# Patient Record
Sex: Male | Born: 1935 | Race: White | Hispanic: No | Smoking: Former smoker
Health system: Southern US, Community
[De-identification: ages and names within clinical notes are randomized; demographics above are authoritative.]

## PROBLEM LIST (undated history)

## (undated) DIAGNOSIS — N400 Enlarged prostate without lower urinary tract symptoms: Secondary | ICD-10-CM

## (undated) HISTORY — PX: CATARACT EXTRACTION: SUR2

## (undated) HISTORY — PX: JOINT REPLACEMENT: SHX530

---

## 2001-03-30 ENCOUNTER — Ambulatory Visit (HOSPITAL_COMMUNITY): Admission: RE | Admit: 2001-03-30 | Discharge: 2001-03-30 | Payer: Self-pay | Admitting: *Deleted

## 2012-11-11 ENCOUNTER — Ambulatory Visit (INDEPENDENT_AMBULATORY_CARE_PROVIDER_SITE_OTHER): Payer: Medicare Other | Admitting: Physician Assistant

## 2012-11-11 ENCOUNTER — Encounter: Payer: Self-pay | Admitting: Physician Assistant

## 2012-11-11 VITALS — BP 116/76 | HR 70 | Temp 97.5°F | Ht 67.0 in | Wt 149.8 lb

## 2012-11-11 DIAGNOSIS — IMO0001 Reserved for inherently not codable concepts without codable children: Secondary | ICD-10-CM

## 2012-11-11 NOTE — Progress Notes (Signed)
Patient ID: Wesley Nguyen, male   DOB: Sep 04, 1931, 79 y.o.   MRN: [id number] Pt here to discuss his wife They have been married for 50+yrs He has been dealing with her progressive Dementia over the last 5 yrs Pt states over the last several months the situation has gotten very bad She has gotten very combative with him and members of the family She is constantly swearing and he has had to cover all of the mirrors because she wants to fight any male she sees Pt is not sleeping at night because this is when she becomes more active He has limited family help due to their own disabilities He is NOT wanting to put her in a home because he feels she will be mistreated  Plan- had a long discussion with pt today regarding situation and possible solutions Gina R sat down with pt to cover possible ho,e health options Also discussed at some point for his own health she made to be placed in a home and he was understanding Almira Coaster will review to see what services are available and f/u with pt

## 2013-04-11 ENCOUNTER — Encounter (INDEPENDENT_AMBULATORY_CARE_PROVIDER_SITE_OTHER): Payer: Self-pay

## 2013-04-11 ENCOUNTER — Ambulatory Visit (INDEPENDENT_AMBULATORY_CARE_PROVIDER_SITE_OTHER): Payer: Medicare Other

## 2013-04-11 DIAGNOSIS — Z23 Encounter for immunization: Secondary | ICD-10-CM

## 2013-04-12 ENCOUNTER — Ambulatory Visit: Payer: Medicare Other | Admitting: Family Medicine

## 2013-04-14 ENCOUNTER — Encounter: Payer: Self-pay | Admitting: Family Medicine

## 2013-04-14 ENCOUNTER — Ambulatory Visit (INDEPENDENT_AMBULATORY_CARE_PROVIDER_SITE_OTHER): Payer: Medicare Other | Admitting: Family Medicine

## 2013-04-14 VITALS — BP 113/73 | HR 72 | Temp 97.8°F | Ht 67.0 in | Wt 151.8 lb

## 2013-04-14 DIAGNOSIS — Z7189 Other specified counseling: Secondary | ICD-10-CM

## 2013-04-14 NOTE — Progress Notes (Signed)
  Subjective:    Patient ID: Corky Blumstein, male    DOB: [birth year], 79 y.o.   MRN: [id number]  HPI This 79 y.o. male presents for evaluation of DNR status.  He has met with his [employer] Friends and has drawn up advance directives and has given his children copies. He states his wife is dying in a nursing home due to alzheimers and he has been watching Her decline and wishes to get his affairs in order.   Review of Systems    No chest pain, SOB, HA, dizziness, vision change, N/V, diarrhea, constipation, dysuria, urinary urgency or frequency, myalgias, arthralgias or rash.  Objective:   Physical Exam Vital signs noted  Well developed well nourished male.  HEENT - Head atraumatic Normocephalic                Eyes - PERRLA, Conjuctiva - clear Sclera- Clear EOMI                Ears - EAC's Wnl TM's Wnl Gross Hearing WNL                Nose - Nares patent                 Throat - oropharanx wnl Respiratory - Lungs CTA bilateral Cardiac - RRR S1 and S2 without murmur GI - Abdomen soft Nontender and bowel sounds active x 4 Extremities - No edema. Neuro - Grossly intact.       Assessment & Plan:  Advance directives - DNR status / Form is signed.  Deatra Canter FNP

## 2013-04-19 NOTE — Addendum Note (Signed)
Addended by: Ardine Eng A on: 04/19/2013 11:19 AM   Modules accepted: Orders

## 2014-03-06 DIAGNOSIS — Z23 Encounter for immunization: Secondary | ICD-10-CM | POA: Diagnosis not present

## 2015-03-02 DIAGNOSIS — Z23 Encounter for immunization: Secondary | ICD-10-CM | POA: Diagnosis not present

## 2015-06-19 ENCOUNTER — Ambulatory Visit (INDEPENDENT_AMBULATORY_CARE_PROVIDER_SITE_OTHER): Payer: Medicare Other | Admitting: Cardiology

## 2015-06-19 ENCOUNTER — Encounter: Payer: Self-pay | Admitting: Cardiology

## 2015-06-19 VITALS — BP 117/77 | HR 82 | Ht 66.5 in | Wt 145.8 lb

## 2015-06-19 DIAGNOSIS — R42 Dizziness and giddiness: Secondary | ICD-10-CM | POA: Diagnosis not present

## 2015-06-19 NOTE — Progress Notes (Signed)
Patient ID: Wesley Nguyen, male   DOB: 03/12/1932, 81 y.o.   MRN: [id number]     Clinical Summary Wesley Nguyen is a 81 y.o.male seen today as a new patient for the following medical problems.  1. Dizziness  - started approx 4 weeks ago. Patient associates the starting of symptoms about 10 days after he started taking naproxen for back pain.  - dizzy spells could occur at any postion. No palpitations. Felt weak all over. No SOB or chest pain. Lasts up to 20 seconds. Could occur up to 3 times a day. Family member would check his pulse at times and note some irregularity. Patient denies any palpitations.  - patient reports drinking coffee x 4-8 cups daily, no tea, no sodas. No EtoH. Limited water intake.  - he recently has cut back on caffeine and symptoms have improved.    PMH 1. BPH   Allergies  Allergen Reactions  . Bactrim [Sulfamethoxazole-Trimethoprim] Rash     Current Outpatient Prescriptions  Medication Sig Dispense Refill  . tamsulosin (FLOMAX) 0.4 MG CAPS capsule Take 0.4 mg by mouth daily.     No current facility-administered medications for this visit.     Past Surgical History  Procedure Laterality Date  . Joint replacement      knee     Allergies  Allergen Reactions  . Bactrim [Sulfamethoxazole-Trimethoprim] Rash      Patient denies any family history of heart disease.    Social History Wesley Nguyen reports that he quit smoking about 52 years ago. His smoking use included Cigarettes. He started smoking about 66 years ago. He smoked 3.00 packs per day. He does not have any smokeless tobacco history on file. Wesley Nguyen reports that he does not drink alcohol.   Review of Systems CONSTITUTIONAL: No weight loss, fever, chills, weakness or fatigue.  HEENT: Eyes: No visual loss, blurred vision, double vision or yellow sclerae.No hearing loss, sneezing, congestion, runny nose or sore throat.  SKIN: No rash or itching.  CARDIOVASCULAR:  RESPIRATORY: No  shortness of breath, cough or sputum.  GASTROINTESTINAL: No anorexia, nausea, vomiting or diarrhea. No abdominal pain or blood.  GENITOURINARY: No burning on urination, no polyuria NEUROLOGICAL:dizziness MUSCULOSKELETAL: No muscle, back pain, joint pain or stiffness.  LYMPHATICS: No enlarged nodes. No history of splenectomy.  PSYCHIATRIC: No history of depression or anxiety.  ENDOCRINOLOGIC: No reports of sweating, cold or heat intolerance. No polyuria or polydipsia.  Marland Kitchen.   Physical Examination Filed Vitals:   06/19/15 1330  BP: 117/77  Pulse: 82   Filed Vitals:   06/19/15 1330  Height: 5' 6.5" (1.689 m)  Weight: 145 lb 12.8 oz (66.134 kg)    Gen: resting comfortably, no acute distress HEENT: no scleral icterus, pupils equal round and reactive, no palptable cervical adenopathy,  CV: RRR, no m/r/g, no jvd Resp: Clear to auscultation bilaterally GI: abdomen is soft, non-tender, non-distended, normal bowel sounds, no hepatosplenomegaly MSK: extremities are warm, no edema.  Skin: warm, no rash Neuro:  no focal deficits Psych: appropriate affect   Diagnostic Studies  EKG(reviewed in clinic today): NSR   Assessment and Plan  1. Dizziness - patient is borderline orthostatic by pulse (12 beat increase with standing) in clinic today. He previously had a very large caffeine intake that since cutting back on has resolved his symptoms. I suspect he was hypovolemic from drinking primarily caffeinated beverages with little to no other intake and I suspect he irregular pulse was also triggered by the stimulant effects of  such large caffeine intake. Since curring back symptoms have resolved - conitnue to follow symptoms, if reoccur off caffeine with increased water/hydration would consider home monitor         Antoine Poche, M.D.

## 2015-06-19 NOTE — Patient Instructions (Signed)
Medication Instructions:  Your physician recommends that you continue on your current medications as directed. Please refer to the Current Medication list given to you today.   Labwork: NONE  Testing/Procedures: NONE  Follow-Up: Your physician wants you to follow-up in: 6 months with Dr.Branch. You will receive a reminder letter in the mail two months in advance. If you don't receive a letter, please call our office to schedule the follow-up appointment.   Any Other Special Instructions Will Be Listed Below (If Applicable).     If you need a refill on your cardiac medications before your next appointment, please call your pharmacy.  Thanks for choosing [facility]!!!     

## 2016-03-25 DIAGNOSIS — Z23 Encounter for immunization: Secondary | ICD-10-CM | POA: Diagnosis not present

## 2017-03-24 DIAGNOSIS — Z23 Encounter for immunization: Secondary | ICD-10-CM | POA: Diagnosis not present

## 2018-03-25 DIAGNOSIS — Z23 Encounter for immunization: Secondary | ICD-10-CM | POA: Diagnosis not present

## 2019-04-11 ENCOUNTER — Other Ambulatory Visit: Payer: Self-pay

## 2019-04-11 ENCOUNTER — Encounter (HOSPITAL_COMMUNITY): Payer: Self-pay

## 2019-04-11 ENCOUNTER — Emergency Department (HOSPITAL_COMMUNITY)
Admission: EM | Admit: 2019-04-11 | Discharge: 2019-04-11 | Disposition: A | Discharge status: Home / Self Care | Payer: Medicare HMO | Attending: Emergency Medicine | Admitting: Emergency Medicine

## 2019-04-11 ENCOUNTER — Emergency Department (HOSPITAL_COMMUNITY): Payer: Medicare HMO

## 2019-04-11 DIAGNOSIS — R059 Cough, unspecified: Secondary | ICD-10-CM

## 2019-04-11 DIAGNOSIS — R05 Cough: Secondary | ICD-10-CM | POA: Insufficient documentation

## 2019-04-11 DIAGNOSIS — R197 Diarrhea, unspecified: Secondary | ICD-10-CM | POA: Diagnosis not present

## 2019-04-11 DIAGNOSIS — Z79899 Other long term (current) drug therapy: Secondary | ICD-10-CM | POA: Diagnosis not present

## 2019-04-11 DIAGNOSIS — Z87891 Personal history of nicotine dependence: Secondary | ICD-10-CM | POA: Diagnosis not present

## 2019-04-11 LAB — COMPREHENSIVE METABOLIC PANEL
ALT: 16 U/L (ref 0–44)
AST: 26 U/L (ref 15–41)
Albumin: 3.7 g/dL (ref 3.5–5.0)
Alkaline Phosphatase: 51 U/L (ref 38–126)
Anion gap: 10 (ref 5–15)
BUN: 11 mg/dL (ref 8–23)
CO2: 25 mmol/L (ref 22–32)
Calcium: 8.7 mg/dL — ABNORMAL LOW (ref 8.9–10.3)
Chloride: 102 mmol/L (ref 98–111)
Creatinine, Ser: 1.35 mg/dL — ABNORMAL HIGH (ref 0.61–1.24)
GFR calc Af Amer: 55 mL/min — ABNORMAL LOW (ref >60)
GFR calc non Af Amer: 48 mL/min — ABNORMAL LOW (ref >60)
Glucose, Bld: 92 mg/dL (ref 70–99)
Potassium: 4.2 mmol/L (ref 3.5–5.1)
Sodium: 137 mmol/L (ref 135–145)
Total Bilirubin: 0.7 mg/dL (ref 0.3–1.2)
Total Protein: 6.6 g/dL (ref 6.5–8.1)

## 2019-04-11 LAB — CBC WITH DIFFERENTIAL/PLATELET
Abs Immature Granulocytes: 0.02 10*3/uL (ref 0.00–0.07)
Basophils Absolute: 0 10*3/uL (ref 0.0–0.1)
Basophils Relative: 1 %
Eosinophils Absolute: 0 10*3/uL (ref 0.0–0.5)
Eosinophils Relative: 0 %
HCT: 42.9 % (ref 39.0–52.0)
Hemoglobin: 13.7 g/dL (ref 13.0–17.0)
Immature Granulocytes: 0 %
Lymphocytes Relative: 25 %
Lymphs Abs: 1.6 10*3/uL (ref 0.7–4.0)
MCH: 29.3 pg (ref 26.0–34.0)
MCHC: 31.9 g/dL (ref 30.0–36.0)
MCV: 91.9 fL (ref 80.0–100.0)
Monocytes Absolute: 0.8 10*3/uL (ref 0.1–1.0)
Monocytes Relative: 13 %
Neutro Abs: 3.9 10*3/uL (ref 1.7–7.7)
Neutrophils Relative %: 61 %
Platelets: 212 10*3/uL (ref 150–400)
RBC: 4.67 MIL/uL (ref 4.22–5.81)
RDW: 14.4 % (ref 11.5–15.5)
WBC: 6.5 10*3/uL (ref 4.0–10.5)
nRBC: 0 % (ref 0.0–0.2)

## 2019-04-11 MED ORDER — ONDANSETRON 4 MG PO TBDP: 4.0000 mg | ORAL_TABLET | Freq: Three times a day (TID) | ORAL | 0 refills | Status: DC | PRN

## 2019-04-11 MED ORDER — PREDNISONE 10 MG (21) PO TBPK: ORAL_TABLET | ORAL | 0 refills | Status: DC

## 2019-04-11 MED ORDER — ALBUTEROL SULFATE HFA 108 (90 BASE) MCG/ACT IN AERS: 2.0000 | INHALATION_SPRAY | RESPIRATORY_TRACT | 0 refills | Status: DC | PRN

## 2019-04-11 MED ORDER — BENZONATATE 100 MG PO CAPS: 100.0000 mg | ORAL_CAPSULE | Freq: Three times a day (TID) | ORAL | 0 refills | Status: DC

## 2019-04-11 NOTE — ED Notes (Signed)
Patient daughter calling asking for an update  Please call Audelia Hives  [phone]

## 2019-04-11 NOTE — ED Notes (Signed)
Patient verbalizes understanding of discharge instructions. Opportunity for questioning and answers were provided. Armband removed by staff, pt discharged from ED. Pt. ambulatory and discharged home.  

## 2019-04-11 NOTE — ED Provider Notes (Signed)
[facility] - Closed EMERGENCY DEPARTMENT Provider Note   CSN: [id number] Arrival date & time: 04/11/19  1436     History   Chief Complaint Chief Complaint  Patient presents with  . Cough  . Diarrhea    HPI Nathanael Krist is a 85 y.o. male.     HPI   Zohair Epp is a 85 y.o. male, presenting to the ED with cough for the last 2 days.  He had a single episode of diarrhea today.  Fever today with T-max of 101 F. He states he had close contact with a person at church on October 14 who he thinks later tested positive for COVID-19. He underwent testing at the health department yesterday, results are pending. Denies shortness of breath, chest pain, abdominal pain, vomiting, hematochezia/melena, urinary symptoms, dizziness, syncope, or any other complaints.   History reviewed. No pertinent past medical history.  There are no active problems to display for this patient.   Past Surgical History:  Procedure Laterality Date  . JOINT REPLACEMENT     knee        Home Medications    Prior to Admission medications   Medication Sig Start Date End Date Taking? Authorizing Provider  albuterol (VENTOLIN HFA) 108 (90 Base) MCG/ACT inhaler Inhale 2 puffs into the lungs every 4 (four) hours as needed for wheezing or shortness of breath. 04/11/19   Orion Vandervort C, PA-C  benzonatate (TESSALON) 100 MG capsule Take 1 capsule (100 mg total) by mouth every 8 (eight) hours. 04/11/19   Falicity Sheets C, PA-C  ondansetron (ZOFRAN ODT) 4 MG disintegrating tablet Take 1 tablet (4 mg total) by mouth every 8 (eight) hours as needed for nausea or vomiting. 04/11/19   Jowell Bossi C, PA-C  predniSONE (STERAPRED UNI-PAK 21 TAB) 10 MG (21) TBPK tablet Take 6 tabs (60mg ) day 1, 5 tabs (50mg ) day 2, 4 tabs (40mg ) day 3, 3 tabs (30mg ) day 4, 2 tabs (20mg ) day 5, and 1 tab (10mg ) day 6. 04/11/19   Georgie Haque C, PA-C  tamsulosin (FLOMAX) 0.4 MG CAPS capsule Take 0.4 mg by mouth daily.    [provider]  traMADol (ULTRAM) 50 MG tablet Take by mouth every 6 (six) hours as needed.    [provider]    Family History No family history on file.  Social History Social History   Tobacco Use  . Smoking status: Former Smoker    Packs/day: 3.00    Types: Cigarettes    Start date: 01/23/1949    Quit date: 06/23/1963    Years since quitting: 55.8  Substance Use Topics  . Alcohol use: No    Alcohol/week: 0.0 standard drinks  . Drug use: No     Allergies   Naproxen sodium and Bactrim [sulfamethoxazole-trimethoprim]   Review of Systems Review of Systems  Constitutional: Positive for fever.  Respiratory: Positive for cough. Negative for shortness of breath.   Cardiovascular: Negative for chest pain and leg swelling.  Gastrointestinal: Positive for diarrhea. Negative for abdominal pain, blood in stool, nausea and vomiting.  Genitourinary: Negative for dysuria, frequency and hematuria.  Neurological: Negative for dizziness, syncope, weakness, light-headedness and headaches.  All other systems reviewed and are negative.    Physical Exam Updated Vital Signs BP (!) 154/92 (BP Location: Left Arm)   Pulse 69   Temp 97.7 F (36.5 C) (Oral)   Resp 16   Ht 5\' 6"  (1.676 m)   Wt 63.5 kg   SpO2 98%  BMI 22.60 kg/m   Physical Exam Vitals signs and nursing note reviewed.  Constitutional:      General: He is not in acute distress.    Appearance: He is well-developed. He is not diaphoretic.  HENT:     Head: Normocephalic and atraumatic.     Mouth/Throat:     Mouth: Mucous membranes are moist.     Pharynx: Oropharynx is clear.  Eyes:     Conjunctiva/sclera: Conjunctivae normal.  Neck:     Musculoskeletal: Neck supple.  Cardiovascular:     Rate and Rhythm: Normal rate and regular rhythm.     Pulses: Normal pulses.          Radial pulses are 2+ on the right side and 2+ on the left side.       Posterior tibial pulses are 2+ on the right side and 2+ on the  left side.     Heart sounds: Normal heart sounds.     Comments: Tactile temperature in the extremities appropriate and equal bilaterally. Pulmonary:     Effort: Pulmonary effort is normal. No respiratory distress.     Breath sounds: Normal breath sounds.  Abdominal:     Palpations: Abdomen is soft.     Tenderness: There is no abdominal tenderness. There is no guarding.  Musculoskeletal:     Right lower leg: No edema.     Left lower leg: No edema.  Lymphadenopathy:     Cervical: No cervical adenopathy.  Skin:    General: Skin is warm and dry.  Neurological:     Mental Status: He is alert.  Psychiatric:        Mood and Affect: Mood and affect normal.        Speech: Speech normal.        Behavior: Behavior normal.      ED Treatments / Results  Labs (all labs ordered are listed, but only abnormal results are displayed) Labs Reviewed  COMPREHENSIVE METABOLIC PANEL - Abnormal; Notable for the following components:      Result Value   Creatinine, Ser 1.35 (*)    Calcium 8.7 (*)    GFR calc non Af Amer 48 (*)    GFR calc Af Amer 55 (*)    All other components within normal limits  CBC WITH DIFFERENTIAL/PLATELET    EKG None  Radiology Dg Chest Portable 1 View  Result Date: 04/11/2019 CLINICAL DATA:  Cough and fever. Diarrhea. EXAM: PORTABLE CHEST 1 VIEW COMPARISON:  None. FINDINGS: The heart size and pulmonary vascularity are normal. Aortic atherosclerosis. Calcified granulomas in both lungs. No acute infiltrates or effusions. Lucency in the upper lung zones suggests emphysema. No acute bone abnormality. IMPRESSION: 1. Aortic atherosclerosis. 2. Possible emphysema. Electronically Signed   By: Lorriane Shire M.D.   On: 04/11/2019 20:32    Procedures Procedures (including critical care time)  Medications Ordered in ED Medications - No data to display   Initial Impression / Assessment and Plan / ED Course  I have reviewed the triage vital signs and the nursing notes.   Pertinent labs & imaging results that were available during my care of the patient were reviewed by me and considered in my medical decision making (see chart for details).        Patient presents with cough, diarrhea, and fever.  Possible exposure to COVID-19.  Results from outside testing are pending. Patient is nontoxic appearing, afebrile here, not tachycardic, not tachypneic, not hypotensive, maintains excellent SPO2 on room air,  and is in no apparent distress.  Isolation recommendations discussed. The patient was given instructions for home care as well as return precautions. Patient voices understanding of these instructions, accepts the plan, and is comfortable with discharge.  Findings and plan of care discussed with Raeford RazorStephen Kohut, MD.   Final Clinical Impressions(s) / ED Diagnoses   Final diagnoses:  Cough  Diarrhea, unspecified type    ED Discharge Orders         Ordered    albuterol (VENTOLIN HFA) 108 (90 Base) MCG/ACT inhaler  Every 4 hours PRN     04/11/19 2109    predniSONE (STERAPRED UNI-PAK 21 TAB) 10 MG (21) TBPK tablet     04/11/19 2109    benzonatate (TESSALON) 100 MG capsule  Every 8 hours     04/11/19 2109    ondansetron (ZOFRAN ODT) 4 MG disintegrating tablet  Every 8 hours PRN     04/11/19 2111           Concepcion LivingJoy, Camey Edell C, PA-C 04/11/19 2120    Raeford RazorKohut, Stephen, MD 04/11/19 2222

## 2019-04-11 NOTE — ED Triage Notes (Signed)
Pt reports cough, diarrhea x1, subjective fevers and sore throat since Sunday.   Pt exposed Wednesday at [place]. Pt a.o, nad , resp e.u

## 2019-04-11 NOTE — Discharge Instructions (Signed)
Your symptoms are likely consistent with a viral illness. Viruses do not require or respond to antibiotics. Treatment is symptomatic care and it is important to note that these symptoms may last for 7-14 days.   Hand washing: Wash your hands throughout the day, but especially before and after touching the face, using the restroom, sneezing, coughing, or touching surfaces that have been coughed or sneezed upon. Hydration: Symptoms of most illnesses will be intensified and complicated by dehydration. Dehydration can also extend the duration of symptoms. Drink plenty of fluids and get plenty of rest. You should be drinking at least half a liter of water an hour to stay hydrated. Electrolyte drinks (ex. Gatorade, Powerade, Pedialyte) are also encouraged. You should be drinking enough fluids to make your urine light yellow, almost clear. If this is not the case, you are not drinking enough water. Please note that some of the treatments indicated below will not be effective if you are not adequately hydrated. Diet: Please concentrate on hydration, however, you may introduce food slowly.  Start with a clear liquid diet, progressed to a full liquid diet, and then bland solids as you are able. Pain or fever: Ibuprofen, Naproxen, or acetaminophen (generic for Tylenol) for pain or fever.  Nausea/vomiting: Use the ondansetron (generic for Zofran) for nausea or vomiting.  This medication may not prevent all vomiting or nausea, but can help facilitate better hydration. Things that can help with nausea/vomiting also include peppermint/menthol candies, vitamin B12, and ginger. Diarrhea: May use medications such as loperamide (Imodium) or Bismuth subsalicylate (Pepto-Bismol). Cough: Use the benzonatate (generic for Tessalon) for cough.  Teas, warm liquids, broths, and honey can also help with cough. Albuterol: May use the albuterol as needed for instances of shortness of breath. Prednisone: Take the prednisone, as  directed, in its entirety. Zyrtec or Claritin: May add these medication daily to control underlying symptoms of congestion, sneezing, and other signs of allergies.  These medications are available over-the-counter. Generics: Cetirizine (generic for Zyrtec) and loratadine (generic for Claritin). Fluticasone: Use fluticasone (generic for Flonase), as directed, for nasal and sinus congestion.  This medication is available over-the-counter. Congestion: Plain guaifenesin (generic for plain Mucinex) may help relieve congestion. Saline sinus rinses and saline nasal sprays may also help relieve congestion.  Sore throat: Warm liquids or Chloraseptic spray may help soothe a sore throat. Gargle twice a day with a salt water solution made from a half teaspoon of salt in a cup of warm water.  Follow up: Follow up with a primary care provider within the next two weeks should symptoms fail to resolve. Return: Return to the ED for significantly worsening symptoms, shortness of breath, persistent vomiting, large amounts of blood in stool, or any other major concerns.  For prescription assistance, may try using prescription discount sites or apps, such as goodrx.com  Patients who have symptoms consistent with COVID-19 should self isolate until: At least 3 days (72 hours) have passed since recovery, defined as resolution of fever without the use of fever reducing medications and improvement in respiratory symptoms (e.g., cough, shortness of breath), and At least 7 days have passed since symptoms first appeared.

## 2019-04-11 NOTE — ED Triage Notes (Signed)
Pt had a COVID test done yesterday at the [facility] but has not gotten results back yet.

## 2019-05-23 HISTORY — PX: EXTERNAL EAR SURGERY: SHX627

## 2019-08-30 ENCOUNTER — Encounter (HOSPITAL_COMMUNITY): Payer: Self-pay

## 2019-08-30 ENCOUNTER — Inpatient Hospital Stay (HOSPITAL_COMMUNITY)
Admission: EM | Admit: 2019-08-30 | Discharge: 2019-09-01 | DRG: 369 | Disposition: A | Discharge status: Home / Self Care | Payer: Medicare HMO | Attending: Internal Medicine | Admitting: Internal Medicine

## 2019-08-30 ENCOUNTER — Other Ambulatory Visit: Payer: Self-pay

## 2019-08-30 DIAGNOSIS — K449 Diaphragmatic hernia without obstruction or gangrene: Secondary | ICD-10-CM | POA: Diagnosis present

## 2019-08-30 DIAGNOSIS — K226 Gastro-esophageal laceration-hemorrhage syndrome: Principal | ICD-10-CM | POA: Diagnosis present

## 2019-08-30 DIAGNOSIS — Z20822 Contact with and (suspected) exposure to covid-19: Secondary | ICD-10-CM | POA: Diagnosis present

## 2019-08-30 DIAGNOSIS — Z881 Allergy status to other antibiotic agents status: Secondary | ICD-10-CM

## 2019-08-30 DIAGNOSIS — E860 Dehydration: Secondary | ICD-10-CM | POA: Diagnosis present

## 2019-08-30 DIAGNOSIS — K920 Hematemesis: Secondary | ICD-10-CM | POA: Diagnosis not present

## 2019-08-30 DIAGNOSIS — R531 Weakness: Secondary | ICD-10-CM

## 2019-08-30 DIAGNOSIS — Z96659 Presence of unspecified artificial knee joint: Secondary | ICD-10-CM | POA: Diagnosis present

## 2019-08-30 DIAGNOSIS — R112 Nausea with vomiting, unspecified: Secondary | ICD-10-CM | POA: Diagnosis present

## 2019-08-30 DIAGNOSIS — K922 Gastrointestinal hemorrhage, unspecified: Secondary | ICD-10-CM

## 2019-08-30 DIAGNOSIS — N400 Enlarged prostate without lower urinary tract symptoms: Secondary | ICD-10-CM | POA: Diagnosis present

## 2019-08-30 DIAGNOSIS — K2210 Ulcer of esophagus without bleeding: Secondary | ICD-10-CM | POA: Diagnosis present

## 2019-08-30 DIAGNOSIS — Z886 Allergy status to analgesic agent status: Secondary | ICD-10-CM

## 2019-08-30 DIAGNOSIS — K2100 Gastro-esophageal reflux disease with esophagitis, without bleeding: Secondary | ICD-10-CM | POA: Diagnosis present

## 2019-08-30 DIAGNOSIS — Z87891 Personal history of nicotine dependence: Secondary | ICD-10-CM

## 2019-08-30 DIAGNOSIS — Z66 Do not resuscitate: Secondary | ICD-10-CM | POA: Diagnosis present

## 2019-08-30 HISTORY — DX: Benign prostatic hyperplasia without lower urinary tract symptoms: N40.0

## 2019-08-30 MED ORDER — SODIUM CHLORIDE 0.9 % IV BOLUS
1000.0000 mL | Freq: Once | INTRAVENOUS | Status: AC
  Administered 2019-08-30: 1000 mL via INTRAVENOUS

## 2019-08-30 MED ORDER — ONDANSETRON HCL 4 MG/2ML IJ SOLN
4.0000 mg | Freq: Once | INTRAMUSCULAR | Status: AC
  Administered 2019-08-30: 4 mg via INTRAVENOUS
  Filled 2019-08-30: qty 2

## 2019-08-30 NOTE — ED Triage Notes (Signed)
Pt arrived via [facility] from home c/o bloody emesis which began this evening. Pt reports dizziness at his time and has 5 episodes of emesis. Pt denies any pain.

## 2019-08-30 NOTE — ED Provider Notes (Signed)
Wesley Nguyen Provider Note   CSN: [id number] Arrival date & time: 08/30/19  2337   History Chief Complaint  Patient presents with  . Emesis    Wesley Nguyen is a 86 y.o. male.  The history is provided by the patient.  He started developing nausea about 1 hour ago and has vomited 4 times.  The first 2 times were undigested food, the last 2 times were blood.  He denies abdominal pain.  He has been feeling weak and dizzy since yesterday.  He denies fever, chills, sweats.  He is not taking any anticoagulants.  No past medical history on file.  There are no problems to display for this patient.   Past Surgical History:  Procedure Laterality Date  . JOINT REPLACEMENT     knee       No family history on file.  Social History   Tobacco Use  . Smoking status: Former Smoker    Packs/day: 3.00    Types: Cigarettes    Start date: 01/23/1949    Quit date: 06/23/1963    Years since quitting: 56.2  Substance Use Topics  . Alcohol use: No    Alcohol/week: 0.0 standard drinks  . Drug use: No    Home Medications Prior to Admission medications   Medication Sig Start Date End Date Taking? Authorizing Provider  albuterol (VENTOLIN HFA) 108 (90 Base) MCG/ACT inhaler Inhale 2 puffs into the lungs every 4 (four) hours as needed for wheezing or shortness of breath. 04/11/19   Joy, Shawn C, PA-C  benzonatate (TESSALON) 100 MG capsule Take 1 capsule (100 mg total) by mouth every 8 (eight) hours. 04/11/19   Joy, Shawn C, PA-C  ondansetron (ZOFRAN ODT) 4 MG disintegrating tablet Take 1 tablet (4 mg total) by mouth every 8 (eight) hours as needed for nausea or vomiting. 04/11/19   Joy, Shawn C, PA-C  predniSONE (STERAPRED UNI-PAK 21 TAB) 10 MG (21) TBPK tablet Take 6 tabs (60mg ) day 1, 5 tabs (50mg ) day 2, 4 tabs (40mg ) day 3, 3 tabs (30mg ) day 4, 2 tabs (20mg ) day 5, and 1 tab (10mg ) day 6. 04/11/19   Joy, Shawn C, PA-C  tamsulosin (FLOMAX) 0.4 MG CAPS capsule Take 0.4 mg  by mouth daily.    [provider]  traMADol (ULTRAM) 50 MG tablet Take by mouth every 6 (six) hours as needed.    [provider]    Allergies    Naproxen sodium and Bactrim [sulfamethoxazole-trimethoprim]  Review of Systems   Review of Systems  All other systems reviewed and are negative.   Physical Exam Updated Vital Signs BP 119/77   Pulse 72   Temp (!) 97.4 F (36.3 C) (Oral)   Resp 16   Ht 5\' 6"  (1.676 m)   Wt 80.3 kg   SpO2 100%   BMI 28.58 kg/m   Physical Exam Vitals and nursing note reviewed.   86 year old male, resting comfortably and in no acute distress. Vital signs are normal. Oxygen saturation is 100%, which is normal. Head is normocephalic and atraumatic. PERRLA, EOMI. Oropharynx is clear. Neck is nontender and supple without adenopathy or JVD. Back is nontender and there is no CVA tenderness. Lungs are clear without rales, wheezes, or rhonchi. Chest is nontender. Heart has regular rate and rhythm without murmur. Abdomen is soft, flat, nontender without masses or hepatosplenomegaly and peristalsis is hypoactive. Extremities have no cyanosis or edema, full range of motion is present. Skin is warm and  dry without rash. Neurologic: Mental status is normal, cranial nerves are intact, there are no motor or sensory deficits.  ED Results / Procedures / Treatments   Labs (all labs ordered are listed, but only abnormal results are displayed) Labs Reviewed  COMPREHENSIVE METABOLIC PANEL - Abnormal; Notable for the following components:      Result Value   Glucose, Bld 159 (*)    Calcium 8.0 (*)    Total Protein 5.8 (*)    Albumin 3.3 (*)    GFR calc non Af Amer 57 (*)    All other components within normal limits  CBC WITH DIFFERENTIAL/PLATELET - Abnormal; Notable for the following components:   WBC 13.6 (*)    RBC 3.84 (*)    Hemoglobin 11.6 (*)    HCT 36.1 (*)    Neutro Abs 11.6 (*)    Abs Immature Granulocytes 0.10 (*)    All other  components within normal limits  HEMOGLOBIN AND HEMATOCRIT, BLOOD - Abnormal; Notable for the following components:   Hemoglobin 11.9 (*)    HCT 36.9 (*)    All other components within normal limits  OCCULT BLOOD GASTRIC / DUODENUM (SPECIMEN CUP) - Abnormal; Notable for the following components:   Occult Blood, Gastric POSITIVE (*)    All other components within normal limits  SARS CORONAVIRUS 2 (TAT 6-24 HRS)  LIPASE, BLOOD  TYPE AND SCREEN    EKG EKG Interpretation  Date/Time:  Wednesday August 30 2019 23:54:11 EST Ventricular Rate:  69 PR Interval:    QRS Duration: 102 QT Interval:  425 QTC Calculation: 456 R Axis:   86 Text Interpretation: Sinus rhythm Atrial premature complex Borderline right axis deviation No old tracing to compare Confirmed by Delora Fuel ([id number]) on 08/30/2019 11:58:25 PM  Procedures Procedures  Medications Ordered in ED Medications  sodium chloride 0.9 % bolus 1,000 mL (0 mLs Intravenous Stopped 08/31/19 0122)  ondansetron (ZOFRAN) injection 4 mg (4 mg Intravenous Given 08/30/19 2358)  prochlorperazine (COMPAZINE) injection 10 mg (10 mg Intravenous Given 08/31/19 0419)    ED Course  I have reviewed the triage vital signs and the nursing notes.  Pertinent labs & imaging results that were available during my care of the patient were reviewed by me and considered in my medical decision making (see chart for details).  MDM Rules/Calculators/A&P Nausea and vomiting with emesis of red material which may have been blood.  Exam is benign.  Old records are reviewed, and he has no relevant past visits.  Will check screening labs, give IV fluids and ondansetron.  Labs show approximately 2 g drop in hemoglobin compared with 6 months ago.  He initially had some improvement in nausea, but nausea progressed.  He was given prochlorperazine but vomited some dark material following that.  This was tested and found to be Gastroccult positive.  Renal function and hepatic  function are normal.  Attempt was made to get orthostatic vital signs, but he was unable to stand because of weakness.  Repeat hemoglobin actually showed hemoglobin had risen slightly.  However, given episode of hematemesis and severe weakness, decision was made to admit.  Case is discussed with Dr. Humphrey Rolls of [facility], who agrees to admit the patient.  Final Clinical Impression(s) / ED Diagnoses Final diagnoses:  Upper gastrointestinal bleed    Rx / DC Orders ED Discharge Orders    None       Delora Fuel, MD 95/62/13 [phone]

## 2019-08-31 ENCOUNTER — Observation Stay (HOSPITAL_COMMUNITY): Payer: Medicare HMO | Admitting: Anesthesiology

## 2019-08-31 ENCOUNTER — Encounter (HOSPITAL_COMMUNITY): Payer: Self-pay | Admitting: Internal Medicine

## 2019-08-31 ENCOUNTER — Encounter (HOSPITAL_COMMUNITY)
Admission: EM | Disposition: A | Discharge status: Home / Self Care | Payer: Self-pay | Source: Home / Self Care | Attending: Internal Medicine

## 2019-08-31 DIAGNOSIS — R531 Weakness: Secondary | ICD-10-CM

## 2019-08-31 DIAGNOSIS — R112 Nausea with vomiting, unspecified: Secondary | ICD-10-CM | POA: Diagnosis not present

## 2019-08-31 DIAGNOSIS — K209 Esophagitis, unspecified without bleeding: Secondary | ICD-10-CM

## 2019-08-31 DIAGNOSIS — K920 Hematemesis: Secondary | ICD-10-CM | POA: Diagnosis present

## 2019-08-31 HISTORY — PX: ESOPHAGOGASTRODUODENOSCOPY (EGD) WITH PROPOFOL: SHX5813

## 2019-08-31 LAB — CBC
HCT: 36 % — ABNORMAL LOW (ref 39.0–52.0)
Hemoglobin: 11.4 g/dL — ABNORMAL LOW (ref 13.0–17.0)
MCH: 29.1 pg (ref 26.0–34.0)
MCHC: 31.7 g/dL (ref 30.0–36.0)
MCV: 91.8 fL (ref 80.0–100.0)
Platelets: 192 10*3/uL (ref 150–400)
RBC: 3.92 MIL/uL — ABNORMAL LOW (ref 4.22–5.81)
RDW: 13.9 % (ref 11.5–15.5)
WBC: 10.6 10*3/uL — ABNORMAL HIGH (ref 4.0–10.5)
nRBC: 0 % (ref 0.0–0.2)

## 2019-08-31 LAB — CBC WITH DIFFERENTIAL/PLATELET
Abs Immature Granulocytes: 0.1 10*3/uL — ABNORMAL HIGH (ref 0.00–0.07)
Basophils Absolute: 0 10*3/uL (ref 0.0–0.1)
Basophils Relative: 0 %
Eosinophils Absolute: 0.1 10*3/uL (ref 0.0–0.5)
Eosinophils Relative: 0 %
HCT: 36.1 % — ABNORMAL LOW (ref 39.0–52.0)
Hemoglobin: 11.6 g/dL — ABNORMAL LOW (ref 13.0–17.0)
Immature Granulocytes: 1 %
Lymphocytes Relative: 10 %
Lymphs Abs: 1.3 10*3/uL (ref 0.7–4.0)
MCH: 30.2 pg (ref 26.0–34.0)
MCHC: 32.1 g/dL (ref 30.0–36.0)
MCV: 94 fL (ref 80.0–100.0)
Monocytes Absolute: 0.6 10*3/uL (ref 0.1–1.0)
Monocytes Relative: 4 %
Neutro Abs: 11.6 10*3/uL — ABNORMAL HIGH (ref 1.7–7.7)
Neutrophils Relative %: 85 %
Platelets: 208 10*3/uL (ref 150–400)
RBC: 3.84 MIL/uL — ABNORMAL LOW (ref 4.22–5.81)
RDW: 13.9 % (ref 11.5–15.5)
WBC: 13.6 10*3/uL — ABNORMAL HIGH (ref 4.0–10.5)
nRBC: 0 % (ref 0.0–0.2)

## 2019-08-31 LAB — COMPREHENSIVE METABOLIC PANEL
ALT: 16 U/L (ref 0–44)
AST: 22 U/L (ref 15–41)
Albumin: 3.3 g/dL — ABNORMAL LOW (ref 3.5–5.0)
Alkaline Phosphatase: 45 U/L (ref 38–126)
Anion gap: 6 (ref 5–15)
BUN: 17 mg/dL (ref 8–23)
CO2: 25 mmol/L (ref 22–32)
Calcium: 8 mg/dL — ABNORMAL LOW (ref 8.9–10.3)
Chloride: 106 mmol/L (ref 98–111)
Creatinine, Ser: 1.16 mg/dL (ref 0.61–1.24)
GFR calc Af Amer: >60 mL/min (ref >60)
GFR calc non Af Amer: 57 mL/min — ABNORMAL LOW (ref >60)
Glucose, Bld: 159 mg/dL — ABNORMAL HIGH (ref 70–99)
Potassium: 3.6 mmol/L (ref 3.5–5.1)
Sodium: 137 mmol/L (ref 135–145)
Total Bilirubin: 0.4 mg/dL (ref 0.3–1.2)
Total Protein: 5.8 g/dL — ABNORMAL LOW (ref 6.5–8.1)

## 2019-08-31 LAB — LIPASE, BLOOD: Lipase: 43 U/L (ref 11–51)

## 2019-08-31 LAB — RESPIRATORY PANEL BY RT PCR (FLU A&B, COVID)
Influenza A by PCR: NEGATIVE
Influenza B by PCR: NEGATIVE
SARS Coronavirus 2 by RT PCR: NEGATIVE

## 2019-08-31 LAB — OCCULT BLOOD GASTRIC / DUODENUM (SPECIMEN CUP)
Occult Blood, Gastric: POSITIVE — AB
pH, Gastric: 2

## 2019-08-31 LAB — HEMOGLOBIN AND HEMATOCRIT, BLOOD
HCT: 36.9 % — ABNORMAL LOW (ref 39.0–52.0)
Hemoglobin: 11.9 g/dL — ABNORMAL LOW (ref 13.0–17.0)

## 2019-08-31 LAB — SARS CORONAVIRUS 2 (TAT 6-24 HRS): SARS Coronavirus 2: NEGATIVE

## 2019-08-31 LAB — TYPE AND SCREEN
ABO/RH(D): O POS
Antibody Screen: NEGATIVE

## 2019-08-31 SURGERY — ESOPHAGOGASTRODUODENOSCOPY (EGD) WITH PROPOFOL
Anesthesia: General

## 2019-08-31 MED ORDER — MEPERIDINE HCL 50 MG/ML IJ SOLN
INTRAMUSCULAR | Status: AC
  Filled 2019-08-31: qty 1

## 2019-08-31 MED ORDER — ALBUTEROL SULFATE (2.5 MG/3ML) 0.083% IN NEBU: 3.0000 mL | INHALATION_SOLUTION | RESPIRATORY_TRACT | Status: DC | PRN

## 2019-08-31 MED ORDER — PANTOPRAZOLE SODIUM 40 MG IV SOLR
40.0000 mg | Freq: Two times a day (BID) | INTRAVENOUS | Status: DC
  Administered 2019-08-31 – 2019-09-01 (×3): 40 mg via INTRAVENOUS
  Filled 2019-08-31 (×3): qty 40

## 2019-08-31 MED ORDER — LACTATED RINGERS IV SOLN
INTRAVENOUS | Status: DC
  Administered 2019-08-31: 1000 mL via INTRAVENOUS

## 2019-08-31 MED ORDER — SUCRALFATE 1 GM/10ML PO SUSP
1.0000 g | Freq: Three times a day (TID) | ORAL | Status: DC
  Administered 2019-08-31 – 2019-09-01 (×4): 1 g via ORAL
  Filled 2019-08-31 (×4): qty 10

## 2019-08-31 MED ORDER — PANTOPRAZOLE SODIUM 40 MG IV SOLR
40.0000 mg | Freq: Once | INTRAVENOUS | Status: AC
  Administered 2019-08-31: 40 mg via INTRAVENOUS
  Filled 2019-08-31: qty 40

## 2019-08-31 MED ORDER — PROPOFOL 500 MG/50ML IV EMUL
INTRAVENOUS | Status: DC | PRN
  Administered 2019-08-31: 150 ug/kg/min via INTRAVENOUS

## 2019-08-31 MED ORDER — PROPOFOL 10 MG/ML IV BOLUS
INTRAVENOUS | Status: DC | PRN
  Administered 2019-08-31 (×2): 20 mg via INTRAVENOUS

## 2019-08-31 MED ORDER — TAMSULOSIN HCL 0.4 MG PO CAPS
0.4000 mg | ORAL_CAPSULE | Freq: Every day | ORAL | Status: DC
  Administered 2019-08-31 – 2019-09-01 (×2): 0.4 mg via ORAL
  Filled 2019-08-31 (×2): qty 1

## 2019-08-31 MED ORDER — SODIUM CHLORIDE 0.9 % IV SOLN: INTRAVENOUS | Status: DC

## 2019-08-31 MED ORDER — ONDANSETRON HCL 4 MG/2ML IJ SOLN
INTRAMUSCULAR | Status: DC | PRN
  Administered 2019-08-31: 4 mg via INTRAVENOUS

## 2019-08-31 MED ORDER — ONDANSETRON HCL 4 MG PO TABS: 4.0000 mg | ORAL_TABLET | Freq: Four times a day (QID) | ORAL | Status: DC | PRN

## 2019-08-31 MED ORDER — ONDANSETRON HCL 4 MG/2ML IJ SOLN: 4.0000 mg | Freq: Four times a day (QID) | INTRAMUSCULAR | Status: DC | PRN

## 2019-08-31 MED ORDER — ACETAMINOPHEN 650 MG RE SUPP: 650.0000 mg | Freq: Four times a day (QID) | RECTAL | Status: DC | PRN

## 2019-08-31 MED ORDER — PANTOPRAZOLE SODIUM 40 MG PO TBEC: 40.0000 mg | DELAYED_RELEASE_TABLET | Freq: Two times a day (BID) | ORAL | Status: DC

## 2019-08-31 MED ORDER — TRAMADOL HCL 50 MG PO TABS: 50.0000 mg | ORAL_TABLET | Freq: Four times a day (QID) | ORAL | Status: DC | PRN

## 2019-08-31 MED ORDER — SODIUM CHLORIDE 0.9 % IV SOLN: INTRAVENOUS | Status: AC

## 2019-08-31 MED ORDER — MIDAZOLAM HCL 5 MG/5ML IJ SOLN
INTRAMUSCULAR | Status: AC
  Filled 2019-08-31: qty 10

## 2019-08-31 MED ORDER — PROCHLORPERAZINE EDISYLATE 10 MG/2ML IJ SOLN
10.0000 mg | Freq: Once | INTRAMUSCULAR | Status: AC
  Administered 2019-08-31: 10 mg via INTRAVENOUS
  Filled 2019-08-31: qty 2

## 2019-08-31 MED ORDER — LIDOCAINE VISCOUS HCL 2 % MT SOLN
OROMUCOSAL | Status: AC
  Filled 2019-08-31: qty 15

## 2019-08-31 MED ORDER — ACETAMINOPHEN 325 MG PO TABS: 650.0000 mg | ORAL_TABLET | Freq: Four times a day (QID) | ORAL | Status: DC | PRN

## 2019-08-31 NOTE — ED Notes (Signed)
Pts son Emin Foree called and updated on Pts status. Dorinda Hill can be reached at [phone] if there are any questions or concerns regarding the Pts care.

## 2019-08-31 NOTE — ED Notes (Signed)
RN obtained verbal consent from Pt to update his family on status. RN spoke with Pts son Dorinda Hill via phone call and updated family on Pts status. RN will call Dorinda Hill [facility]) with any further updates on Pts status.

## 2019-08-31 NOTE — Plan of Care (Signed)

## 2019-08-31 NOTE — Anesthesia Postprocedure Evaluation (Signed)
Anesthesia Post Note  Patient: Wesley Nguyen  Procedure(s) Performed: ESOPHAGOGASTRODUODENOSCOPY (EGD) WITH PROPOFOL (N/A )  Patient location during evaluation: PACU Anesthesia Type: General Level of consciousness: awake Pain management: pain level controlled Vital Signs Assessment: post-procedure vital signs reviewed and stable Respiratory status: spontaneous breathing Cardiovascular status: blood pressure returned to baseline and stable Postop Assessment: no apparent nausea or vomiting Anesthetic complications: no     Last Vitals:  Vitals:   08/31/19 1419 08/31/19 1545  BP: 118/75 114/71  Pulse: 72 (!) 55  Resp: 18 14  Temp: 36.7 C 36.7 C  SpO2: 97% 100%    Last Pain:  Vitals:   08/31/19 1419  TempSrc: Oral  PainSc: 0-No pain                 Jeferson Boozer

## 2019-08-31 NOTE — Progress Notes (Signed)
Per HPI: Wesley Nguyen is a 86 y.o. male with medical history significant of GERD and BPH presented to ED for evaluation of hematemesis.  Patient states that he started developing nausea this evening followed by 4 episodes of nonprojectile vomiting.  The first 2 episodes were only undigested food but the last 2 episodes have undigested food with blood.  Patient denies any fever, chills, headache, dizziness, chest pain, abdominal pain, constipation, diarrhea, melena and blood in the stools.  Patient is not on any blood thinner.  Patient further mentioned that since he came to the ED, his condition has improved and he is feeling much better at this time.  Patient's CODE STATUS is DNR.  3/11: Patient has undergone EGD with findings of erosive esophagitis along with mallory-weiss tear. He is to remain npo today and on carafate along with PPI IV. Appreciate GI assistance. Further management per GI in am with hopeful plans for DC. Repeat CBC in am.  Total care time: 25 minutes.

## 2019-08-31 NOTE — ED Notes (Signed)
Pt experienced first episode of emesis while in ED. MD notified and Gastric Occult collected. Emesis appeared coffee ground liquid in substance.  MD to order antiemetic medication for Pts active symptoms.

## 2019-08-31 NOTE — Progress Notes (Signed)
Report called from PACU

## 2019-08-31 NOTE — H&P (Signed)
History and Physical    Wesley Nguyen ZOX:[id number] DOB: 1932/03/02 DOA: 08/30/2019  PCP: System, Pcp Not In (Confirm with patient/family/NH records and if not entered, this has to be entered at [facility] point of entry)  Patient coming from: Home  I have personally briefly reviewed patient's old medical records in [place]  Chief Complaint: Nausea and vomiting with hematemesis  HPI: Wesley Nguyen is a 86 y.o. male with medical history significant of GERD and BPH presented to ED for evaluation of hematemesis.  Patient states that he started developing nausea this evening followed by 4 episodes of nonprojectile vomiting.  The first 2 episodes were only undigested food but the last 2 episodes have undigested food with blood.  Patient denies any fever, chills, headache, dizziness, chest pain, abdominal pain, constipation, diarrhea, melena and blood in the stools.  Patient is not on any blood thinner.  Patient further mentioned that since he came to the ED, his condition has improved and he is feeling much better at this time.  Patient's CODE STATUS is DNR.  ED Course: Able to the ED patient had temperature of 97.4, blood pressure 134/77, heart rate 73, respiratory rate 18 and oxygen saturation 99% on room air.  Blood work showed hemoglobin of 11.6 and the repeated hemoglobin is 11.9, platelets 208, leukocytes 13.6.  Creatinine is 1.16 while liver enzymes within normal limits.  Patient also had one episode of coffee-ground vomiting in the ED and gastric occult was positive.  In the ED patient was managed with a bolus of normal saline, IV Zofran and IV Compazine and his condition improved.  Review of Systems: As per HPI otherwise 10 point review of systems negative.   History reviewed. No pertinent past medical history.   GERD and BPH reported by patient's son  Past Surgical History:  Procedure Laterality Date  . JOINT REPLACEMENT     knee     reports that he quit smoking about 56 years  ago. His smoking use included cigarettes. He started smoking about 70 years ago. He smoked 3.00 packs per day. He has never used smokeless tobacco. He reports that he does not drink alcohol or use drugs.  Allergies  Allergen Reactions  . Naproxen Sodium   . Bactrim [Sulfamethoxazole-Trimethoprim] Rash    History reviewed. No pertinent family history.   Family history is reviewed but not pertinent  Prior to Admission medications   Medication Sig Start Date End Date Taking? Authorizing Provider  albuterol (VENTOLIN HFA) 108 (90 Base) MCG/ACT inhaler Inhale 2 puffs into the lungs every 4 (four) hours as needed for wheezing or shortness of breath. 04/11/19   Joy, Shawn C, PA-C  tamsulosin (FLOMAX) 0.4 MG CAPS capsule Take 0.4 mg by mouth daily.    [provider]  traMADol (ULTRAM) 50 MG tablet Take by mouth every 6 (six) hours as needed.    [provider]    Physical Exam: Vitals:   08/31/19 0330 08/31/19 0400 08/31/19 0500 08/31/19 0530  BP: 119/80 119/77 117/75 107/66  Pulse: 76 72 75 80  Resp: 20 16 18 18   Temp:      TempSrc:      SpO2: 96% 100% 97%   Weight:      Height:        Constitutional: NAD, calm, comfortable Vitals:   08/31/19 0330 08/31/19 0400 08/31/19 0500 08/31/19 0530  BP: 119/80 119/77 117/75 107/66  Pulse: 76 72 75 80  Resp: 20 16 18 18   Temp:  TempSrc:      SpO2: 96% 100% 97%   Weight:      Height:        General: Patient is a 86 year old Caucasian male in no acute distress. Eyes: PERRL, lids and conjunctivae normal ENMT: Mucous membranes are moist. Posterior pharynx clear of any exudate or lesions.Normal dentition.  Neck: normal, supple, no masses, no thyromegaly Respiratory: clear to auscultation bilaterally, no wheezing, no crackles. Normal respiratory effort. No accessory muscle use.  Cardiovascular: Regular rate and rhythm, no murmurs / rubs / gallops. No extremity edema. 2+ pedal pulses. No carotid bruits.  Abdomen:  Abdomen is soft, nondistended, no tenderness on palpation, no masses palpated. No hepatosplenomegaly. Bowel sounds positive.  Musculoskeletal: no clubbing / cyanosis. No joint deformity upper and lower extremities. Good ROM, no contractures. Normal muscle tone.  Skin: no rashes, lesions, ulcers. No induration Neurologic: CN 2-12 grossly intact. Sensation intact, DTR normal. Strength 5/5 in all 4.  Psychiatric: Normal judgment and insight. Alert and oriented x 3. Normal mood.    Labs on Admission: I have personally reviewed following labs and imaging studies  CBC: Recent Labs  Lab 08/31/19 0001 08/31/19 0400  WBC 13.6*  --   NEUTROABS 11.6*  --   HGB 11.6* 11.9*  HCT 36.1* 36.9*  MCV 94.0  --   PLT 208  --    Basic Metabolic Panel: Recent Labs  Lab 08/31/19 0001  NA 137  K 3.6  CL 106  CO2 25  GLUCOSE 159*  BUN 17  CREATININE 1.16  CALCIUM 8.0*   GFR: Estimated Creatinine Clearance: 46.4 mL/min (by C-G formula based on SCr of 1.16 mg/dL). Liver Function Tests: Recent Labs  Lab 08/31/19 0001  AST 22  ALT 16  ALKPHOS 45  BILITOT 0.4  PROT 5.8*  ALBUMIN 3.3*   Recent Labs  Lab 08/31/19 0001  LIPASE 43   No results for input(s): AMMONIA in the last 168 hours. Coagulation Profile: No results for input(s): INR, PROTIME in the last 168 hours. Cardiac Enzymes: No results for input(s): CKTOTAL, CKMB, CKMBINDEX, TROPONINI in the last 168 hours. BNP (last 3 results) No results for input(s): PROBNP in the last 8760 hours. HbA1C: No results for input(s): HGBA1C in the last 72 hours. CBG: No results for input(s): GLUCAP in the last 168 hours. Lipid Profile: No results for input(s): CHOL, HDL, LDLCALC, TRIG, CHOLHDL, LDLDIRECT in the last 72 hours. Thyroid Function Tests: No results for input(s): TSH, T4TOTAL, FREET4, T3FREE, THYROIDAB in the last 72 hours. Anemia Panel: No results for input(s): VITAMINB12, FOLATE, FERRITIN, TIBC, IRON, RETICCTPCT in the last 72  hours. Urine analysis: No results found for: COLORURINE, APPEARANCEUR, LABSPEC, PHURINE, GLUCOSEU, HGBUR, BILIRUBINUR, KETONESUR, PROTEINUR, UROBILINOGEN, NITRITE, LEUKOCYTESUR  Radiological Exams on Admission: No results found.    Assessment/Plan Principal Problem:    Hematemesis Patient reported 2 episodes of hematemesis before coming to the hospital and had an episode of coffee-ground vomiting in the ED and gastric occult was positive.  Hemoglobin on arrival to the hospital was 11.6 and the repeated hemoglobin is improved to 11.9.  We will repeat the hemoglobin at 8 AM and if the hemoglobin drop below 11.9 or if the patient have any further hematemesis, will consider GI consult.  Patient is hemodynamically stable at this time and states that his nausea is improved.  Continue IV Zofran as needed for nausea and vomiting.  There is no indication for blood transfusion because hemoglobin is improved.  Patient is also given  1 dose of IV Protonix 40 mg because of history of GERD as reported by patient's son earlier to the patient's nurse.  We will continue to monitor.  Active Problems:   Nausea and vomiting Continue IV Zofran as needed for nausea and vomiting    Generalized weakness Generalized weakness most likely secondary to dehydration from nausea and vomiting along with hematemesis.  Patient reported that his condition is improved.  Continue gentle hydration with IV normal saline at the rate of 75 mL/h.  Continue to monitor  Leukocytosis Most likely reactive secondary to stress from nausea and vomiting.  Patient has no fever and no signs or symptoms of any infection.  Continue to monitor    DVT prophylaxis: No chemical prophylaxis because of increased risk of bleeding.  SCDs ordered.  Code Status: DNR (DNR papers present at the bedside) Family Communication: Patient's son is aware of the situation and he is also power of attorney.   Disposition Plan: Patient is admitted for  observation and will be discharged to home if the hemoglobin stays stable and no more episodes of hematemesis.  Consults called: GI consult will be considered if the patient's hemoglobin drop or if he had any further episodes of hematemesis.  Admission status: Observation/MedSurg   Thalia Party MD [facility] Pager [phone]  If 7PM-7AM, please contact night-coverage [email or web address] Password  08/31/2019, 5:46 AM

## 2019-08-31 NOTE — ED Notes (Signed)
EKG given to DR. Preston Fleeting

## 2019-08-31 NOTE — Progress Notes (Signed)
Pt off floor for EGD. Consent to be signed downstairs before procedure.

## 2019-08-31 NOTE — Op Note (Signed)
[facility] Patient Name: Wesley Nguyen Procedure Date: 08/31/2019 3:02 PM MRN: [id number] Date of Birth: 02/08/1932 Attending MD: Gennette Pac , MD CSN: [id number] Age: 86 Admit Type: Inpatient Procedure:                Upper GI endoscopy Indications:              Coffee-ground emesis Providers:                Gennette Pac, MD, Criselda Peaches. Patsy Lager, RN,                            Burke Keels, Technician Referring MD:              Medicines:                Propofol per Anesthesia Complications:            No immediate complications. Estimated Blood Loss:     Estimated blood loss: none. Procedure:                Pre-Anesthesia Assessment:                           - Prior to the procedure, a History and Physical                            was performed, and patient medications and                            allergies were reviewed. The patient's tolerance of                            previous anesthesia was also reviewed. The risks                            and benefits of the procedure and the sedation                            options and risks were discussed with the patient.                            All questions were answered, and informed consent                            was obtained. Prior Anticoagulants: The patient has                            taken no previous anticoagulant or antiplatelet                            agents. ASA Grade Assessment: II - A patient with                            mild systemic disease. After reviewing the risks  and benefits, the patient was deemed in                            satisfactory condition to undergo the procedure.                           After obtaining informed consent, the endoscope was                            passed under direct vision. Throughout the                            procedure, the patient's blood pressure, pulse, and                            oxygen  saturations were monitored continuously. The                            [id number]-H190 ([id number]) scope was introduced through the                            mouth, and advanced to the second part of duodenum.                            The upper GI endoscopy was accomplished without                            difficulty. The patient tolerated the procedure                            well. Scope In: 3:34:34 PM Scope Out: 3:39:41 PM Total Procedure Duration: 0 hours 5 minutes 7 seconds  Findings:      Distal esophageal erosions noted within 5 mm of the GE junction.       Four-quadrant. No Barrett's esophagus or tumor seen. Gastric side of the       GE junction there were a couple of linear excoriations. (1) 2 cm linear       tear with adherent clot. Good hemostasis apparent. Mild hiatal hernia.       Remainder stomach appeared normal.      The duodenal bulb and second portion of the duodenum were normal. Impression:               -Erosive reflux esophagitis. 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                           -  No specimens collected. Moderate Sedation:      Moderate (conscious) sedation was personally administered by an       anesthesia professional. The following parameters were monitored: oxygen       saturation, heart rate, blood pressure, respiratory rate, EKG, adequacy       of pulmonary ventilation, and response to care. Recommendation:           - Return patient to hospital ward for ongoing care.                           - NPO today except sips with medication. Carafate 1                            g slurry 4 times daily x3 days. IV PPI for now.                            Will need once a day therapy chronically to manage                            GERD.  Antiemetics to minimize episodes of nausea                            and vomiting as needed.                           Patient's son, Wesley Nguyen at [phone] and                            reviewed my impression and recommendations in some                            detail. Procedure Code(s):        --- Professional ---                           [phone], Esophagogastroduodenoscopy, flexible,                            transoral; diagnostic, including collection of                            specimen(s) by brushing or washing, when performed                            (separate procedure) Diagnosis Code(s):        --- Professional ---                           K20.90, Esophagitis, unspecified without bleeding                           K92.0, Hematemesis CPT copyright 2019 [employer]. All rights reserved. The codes documented in this report are preliminary and upon coder review may  be revised to meet current compliance requirements. Wesley Nguyen. Wesley Grunow, MD Wesley Richards, MD 08/31/2019 4:11:52 PM This report  has been signed electronically. Number of Addenda: 0

## 2019-08-31 NOTE — Progress Notes (Signed)
Pt back on unit from PACU. No complaints of pain, VSS. Fluids restarted- NS @ 89ml/hr. Voided x1- 300 ml.

## 2019-08-31 NOTE — ED Notes (Signed)
Pt stated that he was unable to stand for orthostatic vital signs due to dizziness.

## 2019-08-31 NOTE — Transfer of Care (Signed)
Immediate Anesthesia Transfer of Care Note  Patient: Wesley Nguyen  Procedure(s) Performed: ESOPHAGOGASTRODUODENOSCOPY (EGD) WITH PROPOFOL (N/A )  Patient Location: PACU  Anesthesia Type:General  Level of Consciousness: drowsy  Airway & Oxygen Therapy: Patient Spontanous Breathing  Post-op Assessment: Report given to RN  Post vital signs: stable  Last Vitals:  Vitals Value Taken Time  BP 114/71 08/31/19 1545  Temp    Pulse 58 08/31/19 1545  Resp 16 08/31/19 1545  SpO2 99 % 08/31/19 1545  Vitals shown include unvalidated device data.  Last Pain:  Vitals:   08/31/19 1419  TempSrc: Oral  PainSc: 0-No pain      Patients Stated Pain Goal: 8 (08/31/19 1419)  Complications: No apparent anesthesia complications

## 2019-08-31 NOTE — Anesthesia Preprocedure Evaluation (Signed)
Anesthesia Evaluation  Patient identified by MRN, date of birth, ID band Patient awake    Reviewed: Allergy & Precautions, NPO status , Patient's Chart, lab work & pertinent test results, reviewed documented beta blocker date and time   Airway Mallampati: II       Dental  (+) Edentulous Upper, Edentulous Lower   Pulmonary former smoker,    Pulmonary exam normal        Cardiovascular negative cardio ROS Normal cardiovascular exam     Neuro/Psych negative neurological ROS     GI/Hepatic negative GI ROS, Neg liver ROS,   Endo/Other  negative endocrine ROS  Renal/GU negative Renal ROS  negative genitourinary   Musculoskeletal  (+) Arthritis , Osteoarthritis,    Abdominal   Peds negative pediatric ROS (+)  Hematology negative hematology ROS (+)   Anesthesia Other Findings   Reproductive/Obstetrics negative OB ROS                             Anesthesia Physical Anesthesia Plan  ASA: III and emergent  Anesthesia Plan: General   Post-op Pain Management:    Induction:   PONV Risk Score and Plan: 2 and Propofol infusion  Airway Management Planned:   Additional Equipment:   Intra-op Plan:   Post-operative Plan:   Informed Consent: I have reviewed the patients History and Physical, chart, labs and discussed the procedure including the risks, benefits and alternatives for the proposed anesthesia with the patient or authorized representative who has indicated his/her understanding and acceptance.       Plan Discussed with: CRNA  Anesthesia Plan Comments:         Anesthesia Quick Evaluation

## 2019-08-31 NOTE — Consult Note (Addendum)
Referring Provider: Erick Blinks, DO Primary Care Physician:  System, Pcp Not In Primary Gastroenterologist:  Previously unassigned, Roetta Sessions, MD   Reason for Consultation:  hematemesis  HPI: Wesley Nguyen is a 86 y.o. male presenting with hematemesis via our EMS.  Symptoms associated with dizziness.  Developed acute onset nausea/vomiting Wednesday evening.  First couple episodes without any notable blood but then with the third episode he vomited fresh blood.  This happened a couple of times at home.  Has significant dizziness and weakness.  Also acute onset diarrhea at the same time.  No melena or rectal bleeding.   He was feeling well up until 1 week ago.  He noted indigestion and has been taking over-the-counter antacids.  No ill contacts.  Had not eaten out prior to illness.  He and his daughter ate the same thing for dinner that night and she did not get sick.  Typically has regular bowel movements.  No abdominal pain associated with this current illness.  Usually does not have much heartburn.  Denies dysphagia.  No shortness of breath or chest pain.  No diaphoresis.  In the emergency department he had coffee-ground emesis.Gastroccult positive.  Hemoglobin on arrival was 11.6.  Back in October his hemoglobin was 13.7.  Hemoglobin this morning down to 11.4.  LFTs unremarkable.  BUN 17, creatinine 1.16.  Lipase normal.  Covid test pending.  White blood cell count admission was 13,600, down to 10,600 today.  Denies NSAIDs or aspirin use.  No prior EGD or colonoscopy.   Prior to Admission medications   Medication Sig Start Date End Date Taking? Authorizing Provider        Joy, Shawn C, PA-C  tamsulosin (FLOMAX) 0.4 MG CAPS capsule Take 0.4 mg by mouth daily.    [provider]        [provider]    Current Facility-Administered Medications  Medication Dose Route Frequency Provider Last Rate Last Admin  . 0.9 %  sodium chloride infusion   Intravenous  Continuous Renda Rolls Z, DO 75 mL/hr at 08/31/19 0600 New Bag at 08/31/19 0600  . acetaminophen (TYLENOL) tablet 650 mg  650 mg Oral Q6H PRN Renda Rolls Z, DO       Or  . acetaminophen (TYLENOL) suppository 650 mg  650 mg Rectal Q6H PRN Renda Rolls Z, DO      . ondansetron [facility]) tablet 4 mg  4 mg Oral Q6H PRN Renda Rolls Z, DO       Or  . ondansetron [facility]) injection 4 mg  4 mg Intravenous Q6H PRN Renda Rolls Z, DO        Allergies as of 08/30/2019 - Review Complete 08/30/2019  Allergen Reaction Noted  . Naproxen sodium  06/19/2015  . Bactrim [sulfamethoxazole-trimethoprim] Rash 11/11/2012    Past Medical History:  Diagnosis Date  . BPH (benign prostatic hyperplasia)     Past Surgical History:  Procedure Laterality Date  . JOINT REPLACEMENT     knee    History reviewed. No pertinent family history.  Social History   Socioeconomic History  . Marital status: Married    Spouse name: Not on file  . Number of children: Not on file  . Years of education: Not on file  . Highest education level: Not on file  Occupational History  . Not on file  Tobacco Use  . Smoking status: Former Smoker    Packs/day: 3.00    Types: Cigarettes    Start date: 01/23/1949  Quit date: 06/23/1963    Years since quitting: 56.2  . Smokeless tobacco: Never Used  Substance and Sexual Activity  . Alcohol use: No    Alcohol/week: 0.0 standard drinks  . Drug use: No  . Sexual activity: Not Currently  Other Topics Concern  . Not on file  Social History Narrative  . Not on file   Social Determinants of Health   Financial Resource Strain:   . Difficulty of Paying Living Expenses:   Food Insecurity:   . Worried About Programme researcher, broadcasting/film/video in the Last Year:   . Barista in the Last Year:   Transportation Needs:   . Freight forwarder (Medical):   Marland Kitchen Lack of Transportation (Non-Medical):   Physical Activity:   . Days of Exercise per Week:   . Minutes of Exercise  per Session:   Stress:   . Feeling of Stress :   Social Connections:   . Frequency of Communication with Friends and Family:   . Frequency of Social Gatherings with Friends and Family:   . Attends Religious Services:   . Active Member of Clubs or Organizations:   . Attends Banker Meetings:   Marland Kitchen Marital Status:   Intimate Partner Violence:   . Fear of Current or Ex-Partner:   . Emotionally Abused:   Marland Kitchen Physically Abused:   . Sexually Abused:      ROS:  General: Negative for anorexia, weight loss, fever, chills, fatigue see HPI Eyes: Negative for vision changes.  ENT: Negative for hoarseness, difficulty swallowing , nasal congestion. CV: Negative for chest pain, angina, palpitations, dyspnea on exertion, peripheral edema.  Respiratory: Negative for dyspnea at rest, dyspnea on exertion, cough, sputum, wheezing.  GI: See history of present illness. GU:  Negative for dysuria, hematuria, urinary incontinence, urinary frequency, nocturnal urination.  MS: Negative for joint pain, low back pain.  Derm: Negative for rash or itching.  Neuro: Negative for weakness, abnormal sensation, seizure, frequent headaches, memory loss, confusion.  See HPI Psych: Negative for anxiety, depression, suicidal ideation, hallucinations.  Endo: Negative for unusual weight change.  Heme: Negative for bruising or bleeding. Allergy: Negative for rash or hives.       Physical Examination: Vital signs in last 24 hours: Temp:  [97.4 F (36.3 C)-98 F (36.7 C)] 98 F (36.7 C) (03/11 0647) Pulse Rate:  [69-82] 70 (03/11 0647) Resp:  [12-20] 16 (03/11 0647) BP: (103-134)/(64-80) 121/75 (03/11 0647) SpO2:  [94 %-100 %] 97 % (03/11 0647) Weight:  [66.9 kg-80.3 kg] 66.9 kg (03/11 0647)    General: Well-nourished, well-developed in no acute distress.  Head: Normocephalic, atraumatic.   Eyes: Conjunctiva pink, no icterus. Mouth: Oropharyngeal mucosa moist and pink , no lesions erythema or  exudate. Neck: Supple without thyromegaly, masses, or lymphadenopathy.  Lungs: Clear to auscultation bilaterally.  Heart: Regular rate and rhythm, no murmurs rubs or gallops.  Abdomen: Bowel sounds are normal, nontender, nondistended, no hepatosplenomegaly or masses, no abdominal bruits or    hernia , no rebound or guarding.   Rectal: Not performed Extremities: No lower extremity edema, clubbing, deformity.  Neuro: Alert and oriented x 4 , grossly normal neurologically.  Skin: Warm and dry, no rash or jaundice.   Psych: Alert and cooperative, normal mood and affect.        Intake/Output from previous day: 03/10 0701 - 03/11 0700 In: 1000 [IV Piggyback:1000] Out: -  Intake/Output this shift: No intake/output data recorded.  Lab Results: CBC  Recent Labs    08/31/19 0001 08/31/19 0400  WBC 13.6*  --   HGB 11.6* 11.9*  HCT 36.1* 36.9*  MCV 94.0  --   PLT 208  --    BMET Recent Labs    08/31/19 0001  NA 137  K 3.6  CL 106  CO2 25  GLUCOSE 159*  BUN 17  CREATININE 1.16  CALCIUM 8.0*   LFT Recent Labs    08/31/19 0001  BILITOT 0.4  ALKPHOS 45  AST 22  ALT 16  PROT 5.8*  ALBUMIN 3.3*    Lipase Recent Labs    08/31/19 0001  LIPASE 43    PT/INR No results for input(s): LABPROT, INR in the last 72 hours.    Imaging Studies: No results found.Minnie.Brome week]   Impression: Pleasant 86 year old gentleman presenting with acute onset vomiting and diarrhea.  After initial 2 episodes of vomiting he developed frank blood in his emesis and subsequently noted coffee-ground emesis in the ED.  Several episodes of loose stool, no frank blood or melena.  Denies NSAID or aspirin use.  No ill contacts.  Feeling better this morning but still has some mild dizziness.  No further nausea.  Has not tried to eat.  Prior to onset of illness, complained of 1 week history of significant indigestion, taking over-the-counter antacids.  No other complaints.  Suspect patient had a  Mallory-Weiss tear in setting of acute gastroenteritis; however, given new onset indigestion type symptoms for the past week followed by acute symptoms in the last 24 hours, would offer upper endoscopy this admission.     Plan: 1. PPI twice daily. 2. Follow-up Covid test. 3. Consider upper endoscopy today.   I have discussed the risks, alternatives, benefits with regards to but not limited to the risk of reaction to medication, bleeding, infection, perforation and the patient is agreeable to proceed. Written consent to be obtained.  We would like to thank you for the opportunity to participate in the care of [facility].  Laureen Ochs. Bernarda Caffey [facility] [phone] 3/11/202110:18 AM     LOS: 0 days    Addendum: spoke to [facility], will check on current covid test to see if can get result sooner, if not, will order rapid. Melanie in endo aware.   Laureen Ochs. Bernarda Caffey [facility] [phone] 3/11/202110:28 AM

## 2019-09-01 DIAGNOSIS — Z96659 Presence of unspecified artificial knee joint: Secondary | ICD-10-CM | POA: Diagnosis present

## 2019-09-01 DIAGNOSIS — K2100 Gastro-esophageal reflux disease with esophagitis, without bleeding: Secondary | ICD-10-CM | POA: Diagnosis present

## 2019-09-01 DIAGNOSIS — K920 Hematemesis: Secondary | ICD-10-CM | POA: Diagnosis present

## 2019-09-01 DIAGNOSIS — Z886 Allergy status to analgesic agent status: Secondary | ICD-10-CM | POA: Diagnosis not present

## 2019-09-01 DIAGNOSIS — K2210 Ulcer of esophagus without bleeding: Secondary | ICD-10-CM | POA: Diagnosis present

## 2019-09-01 DIAGNOSIS — Z881 Allergy status to other antibiotic agents status: Secondary | ICD-10-CM | POA: Diagnosis not present

## 2019-09-01 DIAGNOSIS — K449 Diaphragmatic hernia without obstruction or gangrene: Secondary | ICD-10-CM | POA: Diagnosis present

## 2019-09-01 DIAGNOSIS — Z20822 Contact with and (suspected) exposure to covid-19: Secondary | ICD-10-CM | POA: Diagnosis present

## 2019-09-01 DIAGNOSIS — Z87891 Personal history of nicotine dependence: Secondary | ICD-10-CM | POA: Diagnosis not present

## 2019-09-01 DIAGNOSIS — N400 Enlarged prostate without lower urinary tract symptoms: Secondary | ICD-10-CM | POA: Diagnosis present

## 2019-09-01 DIAGNOSIS — K226 Gastro-esophageal laceration-hemorrhage syndrome: Secondary | ICD-10-CM | POA: Diagnosis present

## 2019-09-01 DIAGNOSIS — Z66 Do not resuscitate: Secondary | ICD-10-CM | POA: Diagnosis present

## 2019-09-01 DIAGNOSIS — E860 Dehydration: Secondary | ICD-10-CM | POA: Diagnosis present

## 2019-09-01 LAB — CBC
HCT: 36 % — ABNORMAL LOW (ref 39.0–52.0)
Hemoglobin: 11.4 g/dL — ABNORMAL LOW (ref 13.0–17.0)
MCH: 29.8 pg (ref 26.0–34.0)
MCHC: 31.7 g/dL (ref 30.0–36.0)
MCV: 94 fL (ref 80.0–100.0)
Platelets: 192 10*3/uL (ref 150–400)
RBC: 3.83 MIL/uL — ABNORMAL LOW (ref 4.22–5.81)
RDW: 14.2 % (ref 11.5–15.5)
WBC: 7.7 10*3/uL (ref 4.0–10.5)
nRBC: 0 % (ref 0.0–0.2)

## 2019-09-01 MED ORDER — SUCRALFATE 1 GM/10ML PO SUSP: 1.0000 g | Freq: Three times a day (TID) | ORAL | 0 refills | Status: DC

## 2019-09-01 MED ORDER — PANTOPRAZOLE SODIUM 40 MG PO TBEC: 40.0000 mg | DELAYED_RELEASE_TABLET | Freq: Every day | ORAL | 1 refills | Status: AC

## 2019-09-01 NOTE — Progress Notes (Signed)
Subjective: Feels better today. No abdominal pain. No further N/V. GERD symptoms improved. No bowel movement overnight. Ha dclear liquid breakfast and tolerated it well. No other overt GI symptoms.  Objective: Vital signs in last 24 hours: Temp:  [97.5 F (36.4 C)-98.1 F (36.7 C)] 98.1 F (36.7 C) (03/12 0530) Pulse Rate:  [55-76] 68 (03/12 0530) Resp:  [14-18] 18 (03/12 0530) BP: (114-136)/(71-82) 123/72 (03/12 0530) SpO2:  [95 %-100 %] 95 % (03/12 0530) Last BM Date: 08/30/19 General:   Alert and oriented, pleasant Head:  Normocephalic and atraumatic. Eyes:  No icterus, sclera clear. Conjuctiva pink.  Heart:  S1, S2 present, no murmurs noted.  Lungs: Clear to auscultation bilaterally, without wheezing, rales, or rhonchi.  Abdomen:  Bowel sounds present, soft, non-tender, non-distended. No HSM or hernias noted. No rebound or guarding. No masses appreciated  Msk:  Symmetrical without gross deformities. Extremities:  Without clubbing or edema. Neurologic:  Alert and  oriented x4;  grossly normal neurologically. Psych:  Alert and cooperative. Normal mood and affect.  Intake/Output from previous day: 03/11 0701 - 03/12 0700 In: 1574.3 [I.V.:1574.3] Out: 1800 [Urine:1800] Intake/Output this shift: No intake/output data recorded.  Lab Results: Recent Labs    08/31/19 0001 08/31/19 0001 08/31/19 0400 08/31/19 0757 09/01/19 0414  WBC 13.6*  --   --  10.6* 7.7  HGB 11.6*   < > 11.9* 11.4* 11.4*  HCT 36.1*   < > 36.9* 36.0* 36.0*  PLT 208  --   --  192 192   < > = values in this interval not displayed.   BMET Recent Labs    08/31/19 0001  NA 137  K 3.6  CL 106  CO2 25  GLUCOSE 159*  BUN 17  CREATININE 1.16  CALCIUM 8.0*   LFT Recent Labs    08/31/19 0001  PROT 5.8*  ALBUMIN 3.3*  AST 22  ALT 16  ALKPHOS 45  BILITOT 0.4   PT/INR No results for input(s): LABPROT, INR in the last 72 hours. Hepatitis Panel No results for input(s): HEPBSAG, HCVAB,  HEPAIGM, HEPBIGM in the last 72 hours.   Studies/Results: No results found.  Assessment: Pleasant 86 year old gentleman presenting with acute onset vomiting and diarrhea.  After initial 2 episodes of vomiting he developed frank blood in his emesis and subsequently noted coffee-ground emesis in the ED.  Several episodes of loose stool, no frank blood or melena.  Denies NSAID or aspirin use.  No ill contacts.  Feeling better yesterday morning but still has some mild dizziness.  No further nausea.  Prior to onset of illness, complained of 1 week history of significant indigestion, taking over-the-counter antacids.   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  Noted a couple linear erosions on the gastric side of the GE junction with a single 2 cm linear tear with adherent clot.  Overall felt Mallory-Weiss tear without endoscopic treatment needed.  Normal duodenum.  Recommended n.p.o. today except sips with meds, Carafate 1 g slurry 4 times daily for 3 days, IV PPI and eventual daily PPI therapy to manage GERD.  Maximize antiemetics for now to minimize nausea and vomiting.  Today he is clinically improved, tolerated clear liquid diet. Symptoms improved. No further N/V, no BM overnight. No GI complaints today.  Plan: 1. Continued PPI bid 2. Will need daily PPI at discharge 3. Monitor for N/V 4. Continue antiemetics 5. Will advance to full liquids or soft diet today, as tolerated  6. Continue carafate per previous recommendations 7. Supportive measures   Thank you for allowing [place] to participate in the care of Wesley Gearlean Alf, DNP, AGNP-C Adult & Gerontological Nurse Practitioner [facility]     LOS: 0 days    09/01/2019, 8:07 AM

## 2019-09-01 NOTE — Progress Notes (Signed)
Bedside report done, assumed care. Pt presents AAOx4, lying supine, hob 30-40 deg. Pt denies c/o at this time. NAD. Will con't to monitor.

## 2019-09-01 NOTE — Discharge Summary (Signed)
Physician Discharge Summary  Constantinos Krempasky IRC:[id number] DOB: Aug 27, 1931 DOA: 08/30/2019  PCP: System, Pcp Not In  Admit date: 08/30/2019  Discharge date: 09/01/2019  Admitted From:Home  Disposition:  Home  Recommendations for Outpatient Follow-up:  1. Follow up with PCP in 1-2 weeks and repeat CBC 2. Follow-up with GI as recommended in the next 2-3 weeks 3. Continue on PPI daily as prescribed 4. Continue on Carafate for 2 more days as prescribed  Home Health: None  Equipment/Devices: None  Discharge Condition: Stable  CODE STATUS: DNR  Diet recommendation: Heart Healthy  Brief/Interim Summary: Per HPI: Wesley Nguyen a 86 y.o.malewith medical history significant ofGERD and BPH presented to ED for evaluation of hematemesis. Patient states that he started developing nausea this evening followed by 4 episodes of nonprojectile vomiting. The first 2 episodes were only undigested food but the last 2 episodes have undigested food with blood. Patient denies any fever, chills, headache, dizziness, chest pain, abdominal pain, constipation, diarrhea, melena and blood in the stools. Patient is not on any blood thinner. Patient further mentioned that since he came to the ED, his condition has improved and he is feeling much better at this time. Patient's CODE STATUS is DNR.  3/11: Patient has undergone EGD with findings of erosive esophagitis along with mallory-weiss tear. He is to remain npo today and on carafate along with PPI IV. Appreciate GI assistance. Further management per GI in am with hopeful plans for DC. Repeat CBC in am.  3/12: Patient has undergone upper endoscopy on 3/11 with findings of reflux erosive esophagitis as well as Mallory-Weiss tear.  She has been kept on PPI IV as well as started on Carafate.  He has been n.p.o. until today and appears to be tolerating diet well.  Hemoglobin levels have remained stable at around 11 and he has not had no further hematemesis  or other symptoms.  He will follow up with GI in the outpatient setting and remain on PPI daily as prescribed as well as Carafate for 2 more days.  No other acute events noted throughout the course of this admission.  Discharge Diagnoses:  Principal Problem:   Hematemesis Active Problems:   Nausea and vomiting   Generalized weakness  Principal discharge diagnosis: Hematemesis secondary to erosive reflux esophagitis with Mallory-Weiss tear.  Discharge Instructions  Discharge Instructions    Diet - low sodium heart healthy   Complete by: As directed    Increase activity slowly   Complete by: As directed      Allergies as of 09/01/2019      Reactions   Naproxen Sodium    Bactrim [sulfamethoxazole-trimethoprim] Rash      Medication List    STOP taking these medications   benzonatate 100 MG capsule Commonly known as: TESSALON   ondansetron 4 MG disintegrating tablet Commonly known as: Zofran ODT   predniSONE 10 MG (21) Tbpk tablet Commonly known as: STERAPRED UNI-PAK 21 TAB     TAKE these medications   albuterol 108 (90 Base) MCG/ACT inhaler Commonly known as: VENTOLIN HFA Inhale 2 puffs into the lungs every 4 (four) hours as needed for wheezing or shortness of breath.   pantoprazole 40 MG tablet Commonly known as: Protonix Take 1 tablet (40 mg total) by mouth daily.   sucralfate 1 GM/10ML suspension Commonly known as: CARAFATE Take 10 mLs (1 g total) by mouth 4 (four) times daily -  with meals and at bedtime for 3 days.   tamsulosin 0.4 MG Caps capsule Commonly  known as: FLOMAX Take 0.4 mg by mouth daily.   traMADol 50 MG tablet Commonly known as: ULTRAM Take by mouth every 6 (six) hours as needed.      Follow-up Information    Rourk, Gerrit Friends, MD Follow up in 2 week(s).   Specialty: Gastroenterology Contact information: [address] [place] [address] [phone]          Allergies  Allergen Reactions  . Naproxen Sodium   . Bactrim  [Sulfamethoxazole-Trimethoprim] Rash    Consultations:  GI   Procedures/Studies:  No results found.  Discharge Exam: Vitals:   08/31/19 2205 09/01/19 0530  BP: 136/74 123/72  Pulse: 62 68  Resp: 16 18  Temp: 97.9 F (36.6 C) 98.1 F (36.7 C)  SpO2: 96% 95%   Vitals:   08/31/19 1620 08/31/19 1639 08/31/19 2205 09/01/19 0530  BP: 122/82 123/75 136/74 123/72  Pulse: 65 64 62 68  Resp: 16 16 16 18   Temp: 98 F (36.7 C) (!) 97.5 F (36.4 C) 97.9 F (36.6 C) 98.1 F (36.7 C)  TempSrc: Oral Oral Oral Oral  SpO2: 99%  96% 95%  Weight:      Height:        General: Pt is alert, awake, not in acute distress Cardiovascular: RRR, S1/S2 +, no rubs, no gallops Respiratory: CTA bilaterally, no wheezing, no rhonchi Abdominal: Soft, NT, ND, bowel sounds + Extremities: no edema, no cyanosis    The results of significant diagnostics from this hospitalization (including imaging, microbiology, ancillary and laboratory) are listed below for reference.     Microbiology: Recent Results (from the past 240 hour(s))  SARS CORONAVIRUS 2 (TAT 6-24 HRS) Nasopharyngeal Nasopharyngeal Swab     Status: None   Collection Time: 08/31/19  4:36 AM   Specimen: Nasopharyngeal Swab  Result Value Ref Range Status   SARS Coronavirus 2 NEGATIVE NEGATIVE Final    Comment: (NOTE) SARS-CoV-2 target nucleic acids are NOT DETECTED. The SARS-CoV-2 RNA is generally detectable in upper and lower respiratory specimens during the acute phase of infection. Negative results do not preclude SARS-CoV-2 infection, do not rule out co-infections with other pathogens, and should not be used as the sole basis for treatment or other patient management decisions. Negative results must be combined with clinical observations, patient history, and epidemiological information. The expected result is Negative. Fact Sheet for Patients: 10/31/19 Fact Sheet for Healthcare  Providers: HairSlick.no This test is not yet approved or cleared by the [place] FDA and  has been authorized for detection and/or diagnosis of SARS-CoV-2 by FDA under an Emergency Use Authorization (EUA). This EUA will remain  in effect (meaning this test can be used) for the duration of the COVID-19 declaration under Section 56 4(b)(1) of the Act, 21 U.S.C. section 360bbb-3(b)(1), unless the authorization is terminated or revoked sooner. Performed at [facility], [address] [place], [place]   Respiratory Panel by RT PCR (Flu A&B, Covid) - Nasopharyngeal Swab     Status: None   Collection Time: 08/31/19 11:09 AM   Specimen: Nasopharyngeal Swab  Result Value Ref Range Status   SARS Coronavirus 2 by RT PCR NEGATIVE NEGATIVE Final    Comment: (NOTE) SARS-CoV-2 target nucleic acids are NOT DETECTED. The SARS-CoV-2 RNA is generally detectable in upper respiratoy specimens during the acute phase of infection. The lowest concentration of SARS-CoV-2 viral copies this assay can detect is 131 copies/mL. A negative result does not preclude SARS-Cov-2 infection and should not be  used as the sole basis for treatment or other patient management decisions. A negative result may occur with  improper specimen collection/handling, submission of specimen other than nasopharyngeal swab, presence of viral mutation(s) within the areas targeted by this assay, and inadequate number of viral copies (<131 copies/mL). A negative result must be combined with clinical observations, patient history, and epidemiological information. The expected result is Negative. Fact Sheet for Patients:  https://www.moore.com/ Fact Sheet for Healthcare Providers:  https://www.young.biz/ This test is not yet ap proved or cleared by the [place] FDA and  has been authorized for detection and/or diagnosis of SARS-CoV-2 by FDA  under an Emergency Use Authorization (EUA). This EUA will remain  in effect (meaning this test can be used) for the duration of the COVID-19 declaration under Section 564(b)(1) of the Act, 21 U.S.C. section 360bbb-3(b)(1), unless the authorization is terminated or revoked sooner.    Influenza A by PCR NEGATIVE NEGATIVE Final   Influenza B by PCR NEGATIVE NEGATIVE Final    Comment: (NOTE) The Xpert Xpress SARS-CoV-2/FLU/RSV assay is intended as an aid in  the diagnosis of influenza from Nasopharyngeal swab specimens and  should not be used as a sole basis for treatment. Nasal washings and  aspirates are unacceptable for Xpert Xpress SARS-CoV-2/FLU/RSV  testing. Fact Sheet for Patients: https://www.moore.com/ Fact Sheet for Healthcare Providers: https://www.young.biz/ This test is not yet approved or cleared by the [place] FDA and  has been authorized for detection and/or diagnosis of SARS-CoV-2 by  FDA under an Emergency Use Authorization (EUA). This EUA will remain  in effect (meaning this test can be used) for the duration of the  Covid-19 declaration under Section 564(b)(1) of the Act, 21  U.S.C. section 360bbb-3(b)(1), unless the authorization is  terminated or revoked. Performed at [facility], [address] [place], [place] [address]      Labs: BNP (last 3 results) No results for input(s): BNP in the last 8760 hours. Basic Metabolic Panel: Recent Labs  Lab 08/31/19 0001  NA 137  K 3.6  CL 106  CO2 25  GLUCOSE 159*  BUN 17  CREATININE 1.16  CALCIUM 8.0*   Liver Function Tests: Recent Labs  Lab 08/31/19 0001  AST 22  ALT 16  ALKPHOS 45  BILITOT 0.4  PROT 5.8*  ALBUMIN 3.3*   Recent Labs  Lab 08/31/19 0001  LIPASE 43   No results for input(s): AMMONIA in the last 168 hours. CBC: Recent Labs  Lab 08/31/19 0001 08/31/19 0400 08/31/19 0757 09/01/19 0414  WBC 13.6*  --  10.6* 7.7  NEUTROABS 11.6*  --    --   --   HGB 11.6* 11.9* 11.4* 11.4*  HCT 36.1* 36.9* 36.0* 36.0*  MCV 94.0  --  91.8 94.0  PLT 208  --  192 192   Cardiac Enzymes: No results for input(s): CKTOTAL, CKMB, CKMBINDEX, TROPONINI in the last 168 hours. BNP: Invalid input(s): POCBNP CBG: No results for input(s): GLUCAP in the last 168 hours. D-Dimer No results for input(s): DDIMER in the last 72 hours. Hgb A1c No results for input(s): HGBA1C in the last 72 hours. Lipid Profile No results for input(s): CHOL, HDL, LDLCALC, TRIG, CHOLHDL, LDLDIRECT in the last 72 hours. Thyroid function studies No results for input(s): TSH, T4TOTAL, T3FREE, THYROIDAB in the last 72 hours.  Invalid input(s): FREET3 Anemia work up No results for input(s): VITAMINB12, FOLATE, FERRITIN, TIBC, IRON, RETICCTPCT in the last 72 hours. Urinalysis No results found for: COLORURINE, APPEARANCEUR,  LABSPEC, PHURINE, GLUCOSEU, HGBUR, BILIRUBINUR, KETONESUR, PROTEINUR, UROBILINOGEN, NITRITE, LEUKOCYTESUR Sepsis Labs Invalid input(s): PROCALCITONIN,  WBC,  LACTICIDVEN Microbiology Recent Results (from the past 240 hour(s))  SARS CORONAVIRUS 2 (TAT 6-24 HRS) Nasopharyngeal Nasopharyngeal Swab     Status: None   Collection Time: 08/31/19  4:36 AM   Specimen: Nasopharyngeal Swab  Result Value Ref Range Status   SARS Coronavirus 2 NEGATIVE NEGATIVE Final    Comment: (NOTE) SARS-CoV-2 target nucleic acids are NOT DETECTED. The SARS-CoV-2 RNA is generally detectable in upper and lower respiratory specimens during the acute phase of infection. Negative results do not preclude SARS-CoV-2 infection, do not rule out co-infections with other pathogens, and should not be used as the sole basis for treatment or other patient management decisions. Negative results must be combined with clinical observations, patient history, and epidemiological information. The expected result is Negative. Fact Sheet for  Patients: HairSlick.nohttps://www.fda.gov/media/138098/download Fact Sheet for Healthcare Providers: quierodirigir.comhttps://www.fda.gov/media/138095/download This test is not yet approved or cleared by the [place] FDA and  has been authorized for detection and/or diagnosis of SARS-CoV-2 by FDA under an Emergency Use Authorization (EUA). This EUA will remain  in effect (meaning this test can be used) for the duration of the COVID-19 declaration under Section 56 4(b)(1) of the Act, 21 U.S.C. section 360bbb-3(b)(1), unless the authorization is terminated or revoked sooner. Performed at [facility], [address] [place], [place] [address]   Respiratory Panel by RT PCR (Flu A&B, Covid) - Nasopharyngeal Swab     Status: None   Collection Time: 08/31/19 11:09 AM   Specimen: Nasopharyngeal Swab  Result Value Ref Range Status   SARS Coronavirus 2 by RT PCR NEGATIVE NEGATIVE Final    Comment: (NOTE) SARS-CoV-2 target nucleic acids are NOT DETECTED. The SARS-CoV-2 RNA is generally detectable in upper respiratoy specimens during the acute phase of infection. The lowest concentration of SARS-CoV-2 viral copies this assay can detect is 131 copies/mL. A negative result does not preclude SARS-Cov-2 infection and should not be used as the sole basis for treatment or other patient management decisions. A negative result may occur with  improper specimen collection/handling, submission of specimen other than nasopharyngeal swab, presence of viral mutation(s) within the areas targeted by this assay, and inadequate number of viral copies (<131 copies/mL). A negative result must be combined with clinical observations, patient history, and epidemiological information. The expected result is Negative. Fact Sheet for Patients:  https://www.moore.com/https://www.fda.gov/media/142436/download Fact Sheet for Healthcare Providers:  https://www.young.biz/https://www.fda.gov/media/142435/download This test is not yet ap proved or cleared by the [place] FDA  and  has been authorized for detection and/or diagnosis of SARS-CoV-2 by FDA under an Emergency Use Authorization (EUA). This EUA will remain  in effect (meaning this test can be used) for the duration of the COVID-19 declaration under Section 564(b)(1) of the Act, 21 U.S.C. section 360bbb-3(b)(1), unless the authorization is terminated or revoked sooner.    Influenza A by PCR NEGATIVE NEGATIVE Final   Influenza B by PCR NEGATIVE NEGATIVE Final    Comment: (NOTE) The Xpert Xpress SARS-CoV-2/FLU/RSV assay is intended as an aid in  the diagnosis of influenza from Nasopharyngeal swab specimens and  should not be used as a sole basis for treatment. Nasal washings and  aspirates are unacceptable for Xpert Xpress SARS-CoV-2/FLU/RSV  testing. Fact Sheet for Patients: https://www.moore.com/https://www.fda.gov/media/142436/download Fact Sheet for Healthcare Providers: https://www.young.biz/https://www.fda.gov/media/142435/download This test is not yet approved or cleared by the [place] FDA and  has been authorized for detection and/or  diagnosis of SARS-CoV-2 by  FDA under an Emergency Use Authorization (EUA). This EUA will remain  in effect (meaning this test can be used) for the duration of the  Covid-19 declaration under Section 564(b)(1) of the Act, 21  U.S.C. section 360bbb-3(b)(1), unless the authorization is  terminated or revoked. Performed at [facility], [address], [place], [place] [address]      Time coordinating discharge: 35 minutes  SIGNED:   Rodena Goldmann, DO [facility] 09/01/2019, 10:38 AM   If 7PM-7AM, please contact night-coverage [email or web address]

## 2019-09-20 ENCOUNTER — Ambulatory Visit: Payer: Medicare HMO | Admitting: Nurse Practitioner

## 2019-09-20 ENCOUNTER — Encounter: Payer: Self-pay | Admitting: Nurse Practitioner

## 2019-09-20 ENCOUNTER — Other Ambulatory Visit: Payer: Self-pay

## 2019-09-20 VITALS — BP 111/73 | HR 73 | Temp 97.7°F | Ht 67.0 in | Wt 147.2 lb

## 2019-09-20 DIAGNOSIS — K2101 Gastro-esophageal reflux disease with esophagitis, with bleeding: Secondary | ICD-10-CM | POA: Diagnosis not present

## 2019-09-20 DIAGNOSIS — K219 Gastro-esophageal reflux disease without esophagitis: Secondary | ICD-10-CM | POA: Insufficient documentation

## 2019-09-20 DIAGNOSIS — K920 Hematemesis: Secondary | ICD-10-CM

## 2019-09-20 DIAGNOSIS — R112 Nausea with vomiting, unspecified: Secondary | ICD-10-CM

## 2019-09-20 NOTE — Assessment & Plan Note (Signed)
No further nausea and vomiting since hospitalization.  Continue to monitor notify us of any recurrence.

## 2019-09-20 NOTE — Progress Notes (Signed)
Referring Provider: No ref. provider found Primary Care Physician:  System, Pcp Not In Primary GI:  Dr. Jena Gauss  Chief Complaint  Patient presents with  . Follow-up    upper GI bleed    HPI:   Wesley Nguyen is a 86 y.o. male who presents for hospital follow-up after upper GI bleed.  The patient was admitted to the hospital from 08/30/2019 through 09/01/2019 for hematemesis.  He presented for evaluation of hematemesis with associated nausea and 4 episodes of nonprojectile vomiting of which the last 2 episodes had some blood.  EGD found erosive esophagitis along with Mallory-Weiss tear.  He was treated with IV PPI and Carafate.  The next day he tolerated a diet and hemoglobin was stable around 11.  He was discharged on PPI and Carafate for 2 more days with recommended GI follow-up.  Today he states he's doing ok overall. Has not seen any further bleeding. Feels regurgitation into his esophagus and at times into his mouth; no burning symptoms or bitter taste. This happens about 2-3 times every evening, typically when sitting upright. Evening meal is typically his largest of the day. Has reduced his coffee intake to 1-2 cups a day, drinking more water. Denies abdominal pain, N/V, hematochezia, melena, fever, chills, unintentional weight loss. Denies URI or flu-like symptoms. Denies loss of sense of taste or smell. Has been tested for COVID during hospitalization and negative (he did have COVID a few months ago). Is scheduled to get the vaccine in about 6 weeks at the [place]. Denies chest pain, dyspnea, dizziness, lightheadedness, syncope, near syncope. Denies any other upper or lower GI symptoms.  Past Medical History:  Diagnosis Date  . BPH (benign prostatic hyperplasia)     Past Surgical History:  Procedure Laterality Date  . ESOPHAGOGASTRODUODENOSCOPY (EGD) WITH PROPOFOL N/A 08/31/2019   Procedure: ESOPHAGOGASTRODUODENOSCOPY (EGD) WITH PROPOFOL;  Surgeon: Corbin Ade, MD;  Location: [facility];  Service: Endoscopy;  Laterality: N/A;  . EXTERNAL EAR SURGERY Right 05/2019   For skin cancer  . JOINT REPLACEMENT     knee    Current Outpatient Medications  Medication Sig Dispense Refill  . pantoprazole (PROTONIX) 40 MG tablet Take 1 tablet (40 mg total) by mouth daily. 30 tablet 1  . sucralfate (CARAFATE) 1 GM/10ML suspension Take 10 mLs (1 g total) by mouth 4 (four) times daily -  with meals and at bedtime for 3 days. (Patient not taking: Reported on 09/20/2019) 120 mL 0   No current facility-administered medications for this visit.    Allergies as of 09/20/2019 - Review Complete 09/20/2019  Allergen Reaction Noted  . Naproxen sodium  06/19/2015  . Bactrim [sulfamethoxazole-trimethoprim] Rash 11/11/2012    Family History  Problem Relation Age of Onset  . Colon cancer Neg Hx     Social History   Socioeconomic History  . Marital status: Married    Spouse name: Not on file  . Number of children: Not on file  . Years of education: Not on file  . Highest education level: Not on file  Occupational History  . Not on file  Tobacco Use  . Smoking status: Former Smoker    Packs/day: 3.00    Types: Cigarettes    Start date: 01/23/1949    Quit date: 06/23/1963    Years since quitting: 56.2  . Smokeless tobacco: Never Used  Substance and Sexual Activity  . Alcohol use: No    Alcohol/week: 0.0 standard drinks  . Drug use:  No  . Sexual activity: Not Currently  Other Topics Concern  . Not on file  Social History Narrative  . Not on file   Social Determinants of Health   Financial Resource Strain:   . Difficulty of Paying Living Expenses:   Food Insecurity:   . Worried About Programme researcher, broadcasting/film/video in the Last Year:   . Barista in the Last Year:   Transportation Needs:   . Freight forwarder (Medical):   Marland Kitchen Lack of Transportation (Non-Medical):   Physical Activity:   . Days of Exercise per Week:   . Minutes of Exercise per Session:   Stress:   .  Feeling of Stress :   Social Connections:   . Frequency of Communication with Friends and Family:   . Frequency of Social Gatherings with Friends and Family:   . Attends Religious Services:   . Active Member of Clubs or Organizations:   . Attends Banker Meetings:   Marland Kitchen Marital Status:     Subjective: Review of Systems  Constitutional: Negative for chills, fever, malaise/fatigue and weight loss.  HENT: Negative for congestion and sore throat.   Respiratory: Negative for cough and shortness of breath.   Cardiovascular: Negative for chest pain and palpitations.  Gastrointestinal: Negative for abdominal pain, blood in stool, diarrhea, melena, nausea and vomiting.       Admits painless reflux  Musculoskeletal: Negative for joint pain and myalgias.  Skin: Negative for rash.  Neurological: Negative for dizziness and weakness.  Endo/Heme/Allergies: Bruises/bleeds easily.  Psychiatric/Behavioral: Negative for depression. The patient is not nervous/anxious.   All other systems reviewed and are negative.    Objective: BP 111/73   Pulse 73   Temp 97.7 F (36.5 C) (Temporal)   Ht 5\' 7"  (1.702 m)   Wt 147 lb 3.2 oz (66.8 kg)   BMI 23.05 kg/m  Physical Exam Vitals and nursing note reviewed.  Constitutional:      General: He is not in acute distress.    Appearance: Normal appearance. He is normal weight. He is not ill-appearing, toxic-appearing or diaphoretic.  HENT:     Head: Normocephalic and atraumatic.     Nose: No congestion or rhinorrhea.  Eyes:     General: No scleral icterus. Cardiovascular:     Rate and Rhythm: Normal rate and regular rhythm.     Heart sounds: Normal heart sounds.  Pulmonary:     Effort: Pulmonary effort is normal.     Breath sounds: Normal breath sounds.  Abdominal:     General: Bowel sounds are normal. There is no distension.     Palpations: Abdomen is soft. There is no hepatomegaly, splenomegaly or mass.     Tenderness: There is no  abdominal tenderness. There is no guarding or rebound.     Hernia: No hernia is present.  Musculoskeletal:     Cervical back: Neck supple.  Skin:    General: Skin is warm and dry.     Coloration: Skin is not jaundiced.     Findings: No bruising or rash.  Neurological:     General: No focal deficit present.     Mental Status: He is alert and oriented to person, place, and time. Mental status is at baseline.  Psychiatric:        Mood and Affect: Mood normal.        Behavior: Behavior normal.        Thought Content: Thought content normal.  09/20/2019 3:42 PM   Disclaimer: This note was dictated with voice recognition software. Similar sounding words can inadvertently be transcribed and may not be corrected upon review.

## 2019-09-20 NOTE — Patient Instructions (Signed)
Your health issues we discussed today were:   Previous nausea and vomiting with bleeding: 1. I am glad you are not having any further nausea and vomiting 2. Let us know if you see any black stools or have any more vomiting with blood 3. I will check your labs today 4. Continue to take your acid blocker which will help heal the lining of your swallowing tube (esophagus) and stomach 5. Call us if you have any worsening or severe symptoms  Overall I recommend:  1. Continue your other current medications 2. Return for follow-up in 3 months 3. Call us if you have any questions or concerns   ---------------------------------------------------------------  COVID-19 Vaccine Information can be found at: [email or web address] For questions related to vaccine distribution or appointments, please email [email or web address] or call [phone].   ---------------------------------------------------------------   At [facility] we value your feedback. You may receive a survey about your visit today. Please share your experience as we strive to create trusting relationships with our patients to provide genuine, compassionate, quality care.  We appreciate your understanding and patience as we review any laboratory studies, imaging, and other diagnostic tests that are ordered as we care for you. Our office policy is 5 business days for review of these results, and any emergent or urgent results are addressed in a timely manner for your best interest. If you do not hear from our office in 1 week, please contact us.   We also encourage the use of MyChart, which contains your medical information for your review as well. If you are not enrolled in this feature, an access code is on this after visit summary for your convenience. Thank you for allowing [place] to be involved in your care.  It was great to see you today!  I hope you have a  great day!!

## 2019-09-20 NOTE — Assessment & Plan Note (Signed)
During hospitalization EGD found reflux esophagitis.  Currently he has sensation of painless reflux.  Typically after his evening meal.  He is currently on PPI and recommended he continue to take this.  I recommended he try to eat a smaller evening meal to see if this helps.  Call us with any worsening or severe symptoms.  Follow-up in 3 months otherwise.

## 2019-09-20 NOTE — Assessment & Plan Note (Signed)
Hematemesis during acute hospitalization found to be reflux esophagitis and Mallory-Weiss tear with significant vomiting.  Hemoglobin got as low as 11.  No further bleeding, no further nausea or vomiting.  I will recheck a CBC today to make sure he has not lost any more blood.  Continue PPI.  Follow-up in 3 months.

## 2019-09-21 ENCOUNTER — Encounter: Payer: Self-pay | Admitting: Internal Medicine

## 2019-09-21 LAB — CBC WITH DIFFERENTIAL/PLATELET
Absolute Monocytes: 697 cells/uL (ref 200–950)
Basophils Absolute: 41 cells/uL (ref 0–200)
Basophils Relative: 0.5 %
Eosinophils Absolute: 180 cells/uL (ref 15–500)
Eosinophils Relative: 2.2 %
HCT: 42.1 % (ref 38.5–50.0)
Hemoglobin: 13.7 g/dL (ref 13.2–17.1)
Lymphs Abs: 2189 cells/uL (ref 850–3900)
MCH: 29.3 pg (ref 27.0–33.0)
MCHC: 32.5 g/dL (ref 32.0–36.0)
MCV: 90.1 fL (ref 80.0–100.0)
MPV: 9.8 fL (ref 7.5–12.5)
Monocytes Relative: 8.5 %
Neutro Abs: 5092 cells/uL (ref 1500–7800)
Neutrophils Relative %: 62.1 %
Platelets: 296 10*3/uL (ref 140–400)
RBC: 4.67 10*6/uL (ref 4.20–5.80)
RDW: 13.5 % (ref 11.0–15.0)
Total Lymphocyte: 26.7 %
WBC: 8.2 10*3/uL (ref 3.8–10.8)

## 2019-09-21 NOTE — Progress Notes (Signed)
CC'ED TO PCP 

## 2020-01-04 ENCOUNTER — Ambulatory Visit: Payer: Medicare HMO | Admitting: Nurse Practitioner

## 2021-04-29 ENCOUNTER — Telehealth (INDEPENDENT_AMBULATORY_CARE_PROVIDER_SITE_OTHER): Payer: Medicare HMO | Admitting: Family Medicine

## 2021-04-29 ENCOUNTER — Ambulatory Visit (INDEPENDENT_AMBULATORY_CARE_PROVIDER_SITE_OTHER): Payer: Medicare HMO

## 2021-04-29 ENCOUNTER — Encounter: Payer: Self-pay | Admitting: Family Medicine

## 2021-04-29 DIAGNOSIS — W19XXXA Unspecified fall, initial encounter: Secondary | ICD-10-CM | POA: Diagnosis not present

## 2021-04-29 DIAGNOSIS — R0781 Pleurodynia: Secondary | ICD-10-CM | POA: Diagnosis not present

## 2021-04-29 DIAGNOSIS — J189 Pneumonia, unspecified organism: Secondary | ICD-10-CM

## 2021-04-29 NOTE — Progress Notes (Signed)
Virtual Visit via MyChart Video Visit Note Due to COVID-19 pandemic this visit was conducted virtually. This visit type was conducted due to national recommendations for restrictions regarding the COVID-19 Pandemic (e.g. social distancing, sheltering in place) in an effort to limit this patient's exposure and mitigate transmission in our community. All issues noted in this document were discussed and addressed.  A physical exam was not performed with this format.   I connected with Wesley Nguyen on 04/29/2021 at 1105 by MyChart Video and verified that I am speaking with the correct person using two identifiers. Wesley Nguyen is currently located at home and family is currently with them during visit. The provider, Wesley Baars, FNP is located in their office at time of visit.  I discussed the limitations, risks, security and privacy concerns of performing an evaluation and management service by video/telephone and the availability of in person appointments. I also discussed with the patient that there may be a patient responsible charge related to this service. The patient expressed understanding and agreed to proceed.  Subjective:  Patient ID: Wesley Nguyen, male    DOB: February 13, 1932, 87 y.o.   MRN: [id number]  Chief Complaint:  Fall and Rib Injury   HPI: Wesley Nguyen is a 87 y.o. male presenting on 04/29/2021 for Fall and Rib Injury   Pt reports a fall over a railroad tie which has caused pain to his left rib area. This happened on Thursday. Pt is afraid his ribs are broken and that he may be developing pneumonia because he has a cough at times. He has not taken anything for his symptoms. States the pain is sharp to stabbing in nature and worse with deep breathing and cough. No heaviness in chest, arm pain, diaphoresis, weakness, palpitations, dizziness, fatigue, confusion, or syncope.   [facility] EHR database incomplete and records will be requested.      Relevant past medical, surgical, family, and social history reviewed and updated as indicated.  Allergies and medications reviewed and updated.   Past Medical History:  Diagnosis Date   BPH (benign prostatic hyperplasia)     Past Surgical History:  Procedure Laterality Date   ESOPHAGOGASTRODUODENOSCOPY (EGD) WITH PROPOFOL N/A 08/31/2019   Procedure: ESOPHAGOGASTRODUODENOSCOPY (EGD) WITH PROPOFOL;  Surgeon: Corbin Ade, MD;  [facility];  Service: Endoscopy;  Laterality: N/A;   EXTERNAL EAR SURGERY Right 05/2019   For skin cancer   JOINT REPLACEMENT     knee    Social History   Socioeconomic History   Marital status: Married    Spouse name: Not on file   Number of children: Not on file   Years of education: Not on file   Highest education level: Not on file  Occupational History   Not on file  Tobacco Use   Smoking status: Former    Packs/day: 3.00    Types: Cigarettes    Start date: 01/23/1949    Quit date: 06/23/1963    Years since quitting: 57.8   Smokeless tobacco: Never  Vaping Use   Vaping Use: Never used  Substance and Sexual Activity   Alcohol use: No    Alcohol/week: 0.0 standard drinks   Drug use: No   Sexual activity: Not Currently  Other Topics Concern   Not on file  Social History Narrative   Not on file   Social Determinants of Health   Financial Resource Strain: Not on file  Food Insecurity: Not on  file  Transportation Needs: Not on file  Physical Activity: Not on file  Stress: Not on file  Social Connections: Not on file  Intimate Partner Violence: Not on file    Outpatient Encounter Medications as of 04/29/2021  Medication Sig   pantoprazole (PROTONIX) 40 MG tablet Take 1 tablet (40 mg total) by mouth daily.   [DISCONTINUED] sucralfate (CARAFATE) 1 GM/10ML suspension Take 10 mLs (1 g total) by mouth 4 (four) times daily -  with meals and at bedtime for 3 days. (Patient not taking: Reported on 09/20/2019)   No  facility-administered encounter medications on file as of 04/29/2021.    Allergies  Allergen Reactions   Naproxen Sodium    Bactrim [Sulfamethoxazole-Trimethoprim] Rash    Review of Systems  Constitutional:  Negative for activity change, appetite change, chills, diaphoresis, fatigue, fever and unexpected weight change.  HENT: Negative.    Eyes: Negative.   Respiratory:  Positive for cough. Negative for chest tightness and shortness of breath.   Cardiovascular:  Positive for chest pain (left ribs). Negative for palpitations and leg swelling.  Gastrointestinal:  Negative for abdominal pain, blood in stool, constipation, diarrhea, nausea and vomiting.  Endocrine: Negative.   Genitourinary:  Negative for dysuria, frequency and urgency.  Musculoskeletal:  Negative for arthralgias and myalgias.  Skin: Negative.   Allergic/Immunologic: Negative.   Neurological:  Negative for dizziness and headaches.  Hematological: Negative.   Psychiatric/Behavioral:  Negative for confusion, hallucinations, sleep disturbance and suicidal ideas.   All other systems reviewed and are negative.       Observations/Objective: No vital signs or physical exam, this was a telephone or virtual health encounter.  Pt alert and oriented, answers all questions appropriately, and able to speak in full sentences. No acute distress noted in video visit.    X-Ray: left ribs with chest: No acute findings. Preliminary x-ray reading by Wesley Baars, FNP-C, [facility].   Assessment and Plan: Wesley Nguyen was seen today for fall and rib injury.  Diagnoses and all orders for this visit:  Fall, initial encounter Rib pain on left side No acute findings, will notify pt if radiology reading differs. Symptomatic care discussed in detail. Pt aware to report any new, worsening, or persistent symptoms.  -     DG Ribs Unilateral W/Chest Left    Follow Up Instructions: Return if symptoms worsen or fail to improve.    I discussed  the assessment and treatment plan with the patient. The patient was provided an opportunity to ask questions and all were answered. The patient agreed with the plan and demonstrated an understanding of the instructions.   The patient was advised to call back or seek an in-person evaluation if the symptoms worsen or if the condition fails to improve as anticipated.  The above assessment and management plan was discussed with the patient. The patient verbalized understanding of and has agreed to the management plan. Patient is aware to call the clinic if they develop any new symptoms or if symptoms persist or worsen. Patient is aware when to return to the clinic for a follow-up visit. Patient educated on when it is appropriate to go to the emergency department.    I provided 15 minutes of MyChart Video-face-to-face time during this encounter. The video started at 1105. The video ended at 1120. The other time was used for coordination of care.    Wesley Baars, FNP-C [facility] [address] [place], [place] [address] [phone] 04/29/2021

## 2021-04-30 MED ORDER — AZITHROMYCIN 250 MG PO TABS: ORAL_TABLET | ORAL | 0 refills | Status: DC

## 2021-04-30 MED ORDER — AMOXICILLIN-POT CLAVULANATE 875-125 MG PO TABS: 1.0000 | ORAL_TABLET | Freq: Two times a day (BID) | ORAL | 0 refills | Status: AC

## 2021-04-30 NOTE — Addendum Note (Signed)
Addended by: Sonny Masters on: 04/30/2021 12:08 PM   Modules accepted: Orders

## 2021-05-05 ENCOUNTER — Telehealth: Payer: Self-pay | Admitting: Family Medicine

## 2021-05-05 NOTE — Telephone Encounter (Signed)
Lmtcb across 11/14

## 2021-05-05 NOTE — Telephone Encounter (Signed)
[facility] needs to follow up with R[facility] in 2 weeks please make appt with rakes lmtcb

## 2021-05-06 NOTE — Telephone Encounter (Signed)
Patient has appointment 05/13/21 with R[facility]

## 2021-05-13 ENCOUNTER — Other Ambulatory Visit: Payer: Self-pay

## 2021-05-13 ENCOUNTER — Encounter: Payer: Self-pay | Admitting: Family Medicine

## 2021-05-13 ENCOUNTER — Ambulatory Visit (INDEPENDENT_AMBULATORY_CARE_PROVIDER_SITE_OTHER): Payer: Medicare HMO | Admitting: Family Medicine

## 2021-05-13 VITALS — BP 125/82 | HR 65 | Temp 97.3°F | Wt 150.0 lb

## 2021-05-13 DIAGNOSIS — J189 Pneumonia, unspecified organism: Secondary | ICD-10-CM

## 2021-05-13 DIAGNOSIS — R0781 Pleurodynia: Secondary | ICD-10-CM

## 2021-05-13 NOTE — Progress Notes (Signed)
Subjective:  Patient ID: Wesley Nguyen, male    DOB: 12/19/1931, 87 y.o.   MRN: [id number]  Patient Care Team: Sonny Masters, FNP as PCP - General (Family Medicine)   Chief Complaint:  2 week f/u   HPI: Wesley Nguyen is a 87 y.o. male presenting on 05/13/2021 for 2 week f/u   Patient presents today for follow-up after recent diagnosis of left lower leg pneumonia.  He had a visit for a fall with left rib pain and chest x-ray with rib detail was completed.  Chest x-ray revealed probable left lower lobe pneumonia.  Patient has completed antibiotic therapy and reports he is doing well.  Patient has had slight left-sided rib pain with deep breathing, but states this is improved greatly.  No cough, fever, chills, weakness, confusion, chest pain, shortness of breath, or fatigue. He normally gets his medical care at the [place].  He just wanted to be established [facility] for acute illnesses.  [facility]    Relevant past medical, surgical, family, and social history reviewed and updated as indicated.  Allergies and medications reviewed and updated. Data reviewed: Chart in Epic.   Past Medical History:  Diagnosis Date   BPH (benign prostatic hyperplasia)     Past Surgical History:  Procedure Laterality Date   ESOPHAGOGASTRODUODENOSCOPY (EGD) WITH PROPOFOL N/A 08/31/2019   Procedure: ESOPHAGOGASTRODUODENOSCOPY (EGD) WITH PROPOFOL;  Surgeon: Corbin Ade, MD;  [facility];  Service: Endoscopy;  Laterality: N/A;   EXTERNAL EAR SURGERY Right 05/2019   For skin cancer   JOINT REPLACEMENT     knee    Social History   Socioeconomic History   Marital status: Married    Spouse name: Not on file   Number of children: Not on file   Years of education: Not on file   Highest education level: Not on file  Occupational History   Not on file  Tobacco Use   Smoking status: Former    Packs/day: 3.00    Types: Cigarettes    Start date: 01/23/1949    Quit date:  06/23/1963    Years since quitting: 57.9   Smokeless tobacco: Never  Vaping Use   Vaping Use: Never used  Substance and Sexual Activity   Alcohol use: No    Alcohol/week: 0.0 standard drinks   Drug use: No   Sexual activity: Not Currently  Other Topics Concern   Not on file  Social History Narrative   Not on file   Social Determinants of Health   Financial Resource Strain: Not on file  Food Insecurity: Not on file  Transportation Needs: Not on file  Physical Activity: Not on file  Stress: Not on file  Social Connections: Not on file  Intimate Partner Violence: Not on file    Outpatient Encounter Medications as of 05/13/2021  Medication Sig   tamsulosin (FLOMAX) 0.4 MG CAPS capsule 1 capsule Orally Once a day   pantoprazole (PROTONIX) 40 MG tablet Take 1 tablet (40 mg total) by mouth daily.   [DISCONTINUED] azithromycin (ZITHROMAX Z-PAK) 250 MG tablet As directed (Patient not taking: Reported on 05/13/2021)   No facility-administered encounter medications on file as of 05/13/2021.    Allergies  Allergen Reactions   Naproxen Sodium    Bactrim [Sulfamethoxazole-Trimethoprim] Rash    Review of Systems  Constitutional:  Negative for activity change, appetite change, chills, diaphoresis, fatigue, fever and unexpected weight change.  HENT:  Positive for hearing loss (chronic). Negative  for congestion.   Eyes: Negative.   Respiratory:  Negative for cough, chest tightness and shortness of breath.   Cardiovascular:  Negative for chest pain, palpitations and leg swelling.  Gastrointestinal:  Negative for abdominal pain, blood in stool, constipation, diarrhea, nausea and vomiting.  Endocrine: Negative.   Genitourinary:  Negative for decreased urine volume, difficulty urinating, dysuria, frequency and urgency.  Musculoskeletal:  Positive for arthralgias (minimal left rib pain). Negative for myalgias.  Skin: Negative.   Allergic/Immunologic: Negative.   Neurological:  Negative  for dizziness, weakness and headaches.  Hematological: Negative.   Psychiatric/Behavioral:  Negative for confusion, hallucinations, sleep disturbance and suicidal ideas.   All other systems reviewed and are negative.      Objective:  BP 125/82   Pulse 65   Temp (!) 97.3 F (36.3 C)   Wt 150 lb (68 kg)   SpO2 98%   BMI 23.49 kg/m    Wt Readings from Last 3 Encounters:  05/13/21 150 lb (68 kg)  09/20/19 147 lb 3.2 oz (66.8 kg)  08/31/19 147 lb 7.8 oz (66.9 kg)    Physical Exam Vitals and nursing note reviewed.  Constitutional:      General: He is not in acute distress.    Appearance: Normal appearance. He is well-developed and well-groomed. He is not ill-appearing, toxic-appearing or diaphoretic.  HENT:     Head: Normocephalic and atraumatic.     Jaw: There is normal jaw occlusion.     Right Ear: Hearing normal.     Left Ear: Hearing normal.     Ears:     Comments: Left hearing aid    Nose: Nose normal.     Mouth/Throat:     Lips: Pink.     Mouth: Mucous membranes are moist.     Pharynx: Oropharynx is clear. Uvula midline.  Eyes:     General: Lids are normal.     Extraocular Movements: Extraocular movements intact.     Conjunctiva/sclera: Conjunctivae normal.     Pupils: Pupils are equal, round, and reactive to light.  Neck:     Thyroid: No thyroid mass, thyromegaly or thyroid tenderness.     Vascular: No carotid bruit or JVD.     Trachea: Trachea and phonation normal.  Cardiovascular:     Rate and Rhythm: Normal rate and regular rhythm.     Chest Wall: PMI is not displaced.     Pulses: Normal pulses.     Heart sounds: Normal heart sounds. No murmur heard.   No friction rub. No gallop.  Pulmonary:     Effort: Pulmonary effort is normal. No respiratory distress.     Breath sounds: Normal breath sounds. No wheezing.  Chest:     Chest wall: Tenderness present. No mass, lacerations, deformity, swelling, crepitus or edema. There is no dullness to percussion.     Abdominal:     General: Bowel sounds are normal. There is no distension or abdominal bruit.     Palpations: Abdomen is soft. There is no hepatomegaly or splenomegaly.     Tenderness: There is no abdominal tenderness. There is no right CVA tenderness or left CVA tenderness.     Hernia: No hernia is present.  Musculoskeletal:        General: Normal range of motion.     Cervical back: Normal range of motion and neck supple.     Right lower leg: No edema.     Left lower leg: No edema.  Lymphadenopathy:  Cervical: No cervical adenopathy.  Skin:    General: Skin is warm and dry.     Capillary Refill: Capillary refill takes less than 2 seconds.     Coloration: Skin is not cyanotic, jaundiced or pale.     Findings: No rash.  Neurological:     General: No focal deficit present.     Mental Status: He is alert and oriented to person, place, and time.     Sensory: Sensation is intact.     Motor: Motor function is intact.     Coordination: Coordination is intact.     Gait: Gait is intact.     Deep Tendon Reflexes: Reflexes are normal and symmetric.  Psychiatric:        Attention and Perception: Attention and perception normal.        Mood and Affect: Mood and affect normal.        Speech: Speech normal.        Behavior: Behavior normal. Behavior is cooperative.        Thought Content: Thought content normal.        Cognition and Memory: Cognition and memory normal.        Judgment: Judgment normal.    Results for orders placed or performed in visit on 09/20/19  CBC with Differential/Platelet  Result Value Ref Range   WBC 8.2 3.8 - 10.8 Thousand/uL   RBC 4.67 4.20 - 5.80 Million/uL   Hemoglobin 13.7 13.2 - 17.1 g/dL   HCT 62.8 31.5 - 17.6 %   MCV 90.1 80.0 - 100.0 fL   MCH 29.3 27.0 - 33.0 pg   MCHC 32.5 32.0 - 36.0 g/dL   RDW 16.0 73.7 - 10.6 %   Platelets 296 140 - 400 Thousand/uL   MPV 9.8 7.5 - 12.5 fL   Neutro Abs 5,092 1,500 - 7,800 cells/uL   Lymphs Abs 2,189 850 -  3,900 cells/uL   Absolute Monocytes 697 200 - 950 cells/uL   Eosinophils Absolute 180 15 - 500 cells/uL   Basophils Absolute 41 0 - 200 cells/uL   Neutrophils Relative % 62.1 %   Total Lymphocyte 26.7 %   Monocytes Relative 8.5 %   Eosinophils Relative 2.2 %   Basophils Relative 0.5 %       Pertinent labs & imaging results that were available during my care of the patient were reviewed by me and considered in my medical decision making.  Assessment & Plan:  Benjie was seen today for 2 week f/u.  Diagnoses and all orders for this visit:  Community acquired pneumonia of left lower lobe of lung Has completed antibiotic therapy and is doing well.  Lung sounds unremarkable during today's visit.  Follow-up in 4 weeks for repeat chest x-ray.  Rib pain on left side Minimal to no pain.  Will repeat chest x-ray in 4 weeks.    Continue all other maintenance medications.  Follow up plan: Return in about 4 weeks (around 06/10/2021), or if symptoms worsen or fail to improve, for CXR.   Continue healthy lifestyle choices, including diet (rich in fruits, vegetables, and lean proteins, and low in salt and simple carbohydrates) and exercise (at least 30 minutes of moderate physical activity daily).    The above assessment and management plan was discussed with the patient. The patient verbalized understanding of and has agreed to the management plan. Patient is aware to call the clinic if they develop any new symptoms or if symptoms persist or worsen. Patient is  aware when to return to the clinic for a follow-up visit. Patient educated on when it is appropriate to go to the emergency department.   Monia Pouch, FNP-C [facility] [phone]

## 2021-05-23 ENCOUNTER — Ambulatory Visit (INDEPENDENT_AMBULATORY_CARE_PROVIDER_SITE_OTHER): Payer: Medicare HMO

## 2021-05-23 VITALS — Ht 67.0 in | Wt 145.0 lb

## 2021-05-23 DIAGNOSIS — Z0000 Encounter for general adult medical examination without abnormal findings: Secondary | ICD-10-CM

## 2021-05-23 NOTE — Patient Instructions (Signed)
Wesley Nguyen , Thank you for taking time to come for your Medicare Wellness Visit. I appreciate your ongoing commitment to your health goals. Please review the following plan we discussed and let me know if I can assist you in the future.   Screening recommendations/referrals: Colonoscopy: No longer required Recommended yearly ophthalmology/optometry visit for glaucoma screening and checkup Recommended yearly dental visit for hygiene and checkup  Vaccinations: Influenza vaccine: Done 03/2021 - Repeat annually  Pneumococcal vaccine: Done at [facility] Tdap vaccine: Done at [facility] Shingles vaccine: Done at [facility]   Covid-19: Janssen done 10/02/2019 - due for booster  Advanced directives: Please bring a copy of your health care power of attorney and living will to the office to be added to your chart at your convenience.   Conditions/risks identified: Keep up the great work!   Next appointment: Follow up in one year for your annual wellness visit.   Preventive Care 53 Years and Older, Male  Preventive care refers to lifestyle choices and visits with your health care provider that can promote health and wellness. What does preventive care include? A yearly physical exam. This is also called an annual well check. Dental exams once or twice a year. Routine eye exams. Ask your health care provider how often you should have your eyes checked. Personal lifestyle choices, including: Daily care of your teeth and gums. Regular physical activity. Eating a healthy diet. Avoiding tobacco and drug use. Limiting alcohol use. Practicing safe sex. Taking low doses of aspirin every day. Taking vitamin and mineral supplements as recommended by your health care provider. What happens during an annual well check? The services and screenings done by your health care provider during your annual well check will depend on your age, overall health, lifestyle risk factors, and family history of disease. Counseling  Your  health care provider may ask you questions about your: Alcohol use. Tobacco use. Drug use. Emotional well-being. Home and relationship well-being. Sexual activity. Eating habits. History of falls. Memory and ability to understand (cognition). Work and work Astronomer. Screening  You may have the following tests or measurements: Height, weight, and BMI. Blood pressure. Lipid and cholesterol levels. These may be checked every 5 years, or more frequently if you are over 62 years old. Skin check. Lung cancer screening. You may have this screening every year starting at age 73 if you have a 30-pack-year history of smoking and currently smoke or have quit within the past 15 years. Fecal occult blood test (FOBT) of the stool. You may have this test every year starting at age 58. Flexible sigmoidoscopy or colonoscopy. You may have a sigmoidoscopy every 5 years or a colonoscopy every 10 years starting at age 94. Prostate cancer screening. Recommendations will vary depending on your family history and other risks. Hepatitis C blood test. Hepatitis B blood test. Sexually transmitted disease (STD) testing. Diabetes screening. This is done by checking your blood sugar (glucose) after you have not eaten for a while (fasting). You may have this done every 1-3 years. Abdominal aortic aneurysm (AAA) screening. You may need this if you are a current or former smoker. Osteoporosis. You may be screened starting at age 19 if you are at high risk. Talk with your health care provider about your test results, treatment options, and if necessary, the need for more tests. Vaccines  Your health care provider may recommend certain vaccines, such as: Influenza vaccine. This is recommended every year. Tetanus, diphtheria, and acellular pertussis (Tdap, Td) vaccine. You may  need a Td booster every 10 years. Zoster vaccine. You may need this after age 72. Pneumococcal 13-valent conjugate (PCV13) vaccine. One dose  is recommended after age 24. Pneumococcal polysaccharide (PPSV23) vaccine. One dose is recommended after age 76. Talk to your health care provider about which screenings and vaccines you need and how often you need them. This information is not intended to replace advice given to you by your health care provider. Make sure you discuss any questions you have with your health care provider. Document Released: 07/05/2015 Document Revised: 02/26/2016 Document Reviewed: 04/09/2015 Elsevier Interactive Patient Education  2017 [employer].  Fall Prevention in the Home Falls can cause injuries. They can happen to people of all ages. There are many things you can do to make your home safe and to help prevent falls. What can I do on the outside of my home? Regularly fix the edges of walkways and driveways and fix any cracks. Remove anything that might make you trip as you walk through a door, such as a raised step or threshold. Trim any bushes or trees on the path to your home. Use bright outdoor lighting. Clear any walking paths of anything that might make someone trip, such as rocks or tools. Regularly check to see if handrails are loose or broken. Make sure that both sides of any steps have handrails. Any raised decks and porches should have guardrails on the edges. Have any leaves, snow, or ice cleared regularly. Use sand or salt on walking paths during winter. Clean up any spills in your garage right away. This includes oil or grease spills. What can I do in the bathroom? Use night lights. Install grab bars by the toilet and in the tub and shower. Do not use towel bars as grab bars. Use non-skid mats or decals in the tub or shower. If you need to sit down in the shower, use a plastic, non-slip stool. Keep the floor dry. Clean up any water that spills on the floor as soon as it happens. Remove soap buildup in the tub or shower regularly. Attach bath mats securely with double-sided non-slip rug  tape. Do not have throw rugs and other things on the floor that can make you trip. What can I do in the bedroom? Use night lights. Make sure that you have a light by your bed that is easy to reach. Do not use any sheets or blankets that are too big for your bed. They should not hang down onto the floor. Have a firm chair that has side arms. You can use this for support while you get dressed. Do not have throw rugs and other things on the floor that can make you trip. What can I do in the kitchen? Clean up any spills right away. Avoid walking on wet floors. Keep items that you use a lot in easy-to-reach places. If you need to reach something above you, use a strong step stool that has a grab bar. Keep electrical cords out of the way. Do not use floor polish or wax that makes floors slippery. If you must use wax, use non-skid floor wax. Do not have throw rugs and other things on the floor that can make you trip. What can I do with my stairs? Do not leave any items on the stairs. Make sure that there are handrails on both sides of the stairs and use them. Fix handrails that are broken or loose. Make sure that handrails are as long as the stairways.  Check any carpeting to make sure that it is firmly attached to the stairs. Fix any carpet that is loose or worn. Avoid having throw rugs at the top or bottom of the stairs. If you do have throw rugs, attach them to the floor with carpet tape. Make sure that you have a light switch at the top of the stairs and the bottom of the stairs. If you do not have them, ask someone to add them for you. What else can I do to help prevent falls? Wear shoes that: Do not have high heels. Have rubber bottoms. Are comfortable and fit you well. Are closed at the toe. Do not wear sandals. If you use a stepladder: Make sure that it is fully opened. Do not climb a closed stepladder. Make sure that both sides of the stepladder are locked into place. Ask someone to  hold it for you, if possible. Clearly mark and make sure that you can see: Any grab bars or handrails. First and last steps. Where the edge of each step is. Use tools that help you move around (mobility aids) if they are needed. These include: Canes. Walkers. Scooters. Crutches. Turn on the lights when you go into a dark area. Replace any light bulbs as soon as they burn out. Set up your furniture so you have a clear path. Avoid moving your furniture around. If any of your floors are uneven, fix them. If there are any pets around you, be aware of where they are. Review your medicines with your doctor. Some medicines can make you feel dizzy. This can increase your chance of falling. Ask your doctor what other things that you can do to help prevent falls. This information is not intended to replace advice given to you by your health care provider. Make sure you discuss any questions you have with your health care provider. Document Released: 04/04/2009 Document Revised: 11/14/2015 Document Reviewed: 07/13/2014 Elsevier Interactive Patient Education  2017 [employer].

## 2021-05-23 NOTE — Progress Notes (Signed)
Subjective:   Wesley Nguyen is a 87 y.o. male who presents for an Initial Medicare Annual Wellness Visit.  Virtual Visit via Telephone Note  I connected with  Wesley Nguyen on 05/23/21 at  9:45 AM EST by telephone and verified that I am speaking with the correct person using two identifiers.  Location: Patient: Home Provider: [facility] Persons participating in the virtual visit: patient/Nurse Health Advisor   I discussed the limitations, risks, security and privacy concerns of performing an evaluation and management service by telephone and the availability of in person appointments. The patient expressed understanding and agreed to proceed.  Interactive audio and video telecommunications were attempted between this nurse and patient, however failed, due to patient having technical difficulties OR patient did not have access to video capability.  We continued and completed visit with audio only.  Some vital signs may be absent or patient reported.   Wesley Nguyen E Wesley Halleck, LPN   Review of Systems     Cardiac Risk Factors include: advanced age (>84men, >67 women);male gender     Objective:    Today's Vitals   05/23/21 0952  Weight: 145 lb (65.8 kg)  Height: 5\' 7"  (1.702 m)   Body mass index is 22.71 kg/m.  Advanced Directives 05/23/2021 08/31/2019 08/30/2019 04/11/2019  Does Patient Have a Medical Advance Directive? Yes No;Yes Yes No  Type of 04/13/2019 of [place];Living will Healthcare Power of [address];Out of facility DNR (pink MOST or yellow form) Healthcare Power of Attorney -  Does patient want to make changes to medical advance directive? - - No - Patient declined -  Copy of Healthcare Power of Attorney in Chart? No - copy requested No - copy requested No - copy requested -  Would patient like information on creating a medical advance directive? - No - Patient declined - No - Patient declined    Current Medications (verified) Outpatient Encounter  Medications as of 05/23/2021  Medication Sig   tamsulosin (FLOMAX) 0.4 MG CAPS capsule 1 capsule Orally Once a day   pantoprazole (PROTONIX) 40 MG tablet Take 1 tablet (40 mg total) by mouth daily.   No facility-administered encounter medications on file as of 05/23/2021.    Allergies (verified) Naproxen sodium and Bactrim [sulfamethoxazole-trimethoprim]   History: Past Medical History:  Diagnosis Date   BPH (benign prostatic hyperplasia)    Past Surgical History:  Procedure Laterality Date   ESOPHAGOGASTRODUODENOSCOPY (EGD) WITH PROPOFOL N/A 08/31/2019   Procedure: ESOPHAGOGASTRODUODENOSCOPY (EGD) WITH PROPOFOL;  Surgeon: 10/31/2019, MD;  [facility];  Service: Endoscopy;  Laterality: N/A;   EXTERNAL EAR SURGERY Right 05/2019   For skin cancer   JOINT REPLACEMENT     knee   Family History  Problem Relation Age of Onset   Colon cancer Neg Hx    Social History   Socioeconomic History   Marital status: Married    Spouse name: Not on file   Number of children: Not on file   Years of education: Not on file   Highest education level: Not on file  Occupational History   Occupation: Retired    Comment: Veteran  Tobacco Use   Smoking status: Former    Packs/day: 3.00    Types: Cigarettes    Start date: 01/23/1949    Quit date: 06/23/1963    Years since quitting: 57.9   Smokeless tobacco: Never  Vaping Use   Vaping Use: Never used  Substance and Sexual Activity   Alcohol use: No  Alcohol/week: 0.0 standard drinks   Drug use: No   Sexual activity: Not Currently  Other Topics Concern   Not on file  Social History Narrative   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   Physically and socially active - still very independent 05/23/21   Social Determinants of Corporate investment banker Strain: Low Risk    Difficulty of Paying Living Expenses: Not very hard  Food Insecurity: No Food Insecurity   Worried About Programme researcher, broadcasting/film/video in the  Last Year: Never true   Barista in the Last Year: Never true  Transportation Needs: No Transportation Needs   Lack of Transportation (Medical): No   Lack of Transportation (Non-Medical): No  Physical Activity: Sufficiently Active   Days of Exercise per Week: 7 days   Minutes of Exercise per Session: 40 min  Stress: No Stress Concern Present   Feeling of Stress : Not at all  Social Connections: Socially Integrated   Frequency of Communication with Friends and Family: More than three times a week   Frequency of Social Gatherings with Friends and Family: More than three times a week   Attends Religious Services: More than 4 times per year   Active Member of [employer] or Organizations: Yes   Attends Engineer, structural: More than 4 times per year   Marital Status: Married    Tobacco Counseling Counseling given: Not Answered   Clinical Intake:  Pre-visit preparation completed: Yes  Pain : No/denies pain     BMI - recorded: 22.71 Nutritional Status: BMI of 19-24  Normal Nutritional Risks: None Diabetes: No  How often do you need to have someone help you when you read instructions, pamphlets, or other written materials from your doctor or pharmacy?: 1 - Never  Diabetic? no  Interpreter Needed?: No  Information entered by :: Wesley Dumond, LPN   Activities of Daily Living In your present state of health, do you have any difficulty performing the following activities: 05/23/2021  Hearing? Y  Vision? N  Difficulty concentrating or making decisions? Y  Walking or climbing stairs? N  Dressing or bathing? N  Doing errands, shopping? N  Preparing Food and eating ? N  Using the Toilet? N  In the past six months, have you accidently leaked urine? N  Do you have problems with loss of bowel control? N  Managing your Medications? N  Managing your Finances? N  Housekeeping or managing your Housekeeping? N  Some recent data might be hidden    Patient Care  Team: Wesley Nguyen, Wesley Albino, FNP as PCP - General (Family Medicine)  Indicate any recent Medical Services you may have received from other than [facility] providers in the past year (date may be approximate).     Assessment:   This is a routine wellness examination for Wesley Nguyen.  Hearing/Vision screen Hearing Screening - Comments:: Wears hearing aids - from [place]  Vision Screening - Comments:: Wears rx glasses - [facility]  Dietary issues and exercise activities discussed: Current Exercise Habits: Home exercise routine, Type of exercise: Other - see comments;stretching;walking (stationary bike each day, multiple leg strength and stretch exercises x 60 each day), Time (Minutes): 45, Frequency (Times/Week): 7, Weekly Exercise (Minutes/Week): 315, Intensity: Moderate, Exercise limited by: None identified   Goals Addressed             This Visit's Progress    DIET -  INCREASE WATER INTAKE         Depression Screen PHQ 2/9 Scores 05/23/2021 05/13/2021  PHQ - 2 Score 0 0  PHQ- 9 Score 2 2    Fall Risk Fall Risk  05/23/2021 05/13/2021  Falls in the past year? 1 1  Number falls in past yr: 1 0  Injury with Fall? 1 1  Risk for fall due to : History of fall(s) -  Follow up Falls prevention discussed;Education provided -    FALL RISK PREVENTION PERTAINING TO THE HOME:  Any stairs in or around the home? No  If so, are there any without handrails? No  Home free of loose throw rugs in walkways, pet beds, electrical cords, etc? Yes  Adequate lighting in your home to reduce risk of falls? Yes   ASSISTIVE DEVICES UTILIZED TO PREVENT FALLS:  Life alert? No  Use of a cane, walker or w/c? No  Grab bars in the bathroom? Yes  Shower chair or bench in shower? Yes  Elevated toilet seat or a handicapped toilet? Yes   TIMED UP AND GO:  Was the test performed? No . Telephonic visit  Cognitive Function:     6CIT Screen 05/23/2021  What Year? 4 points  What month? 0  points  What time? 0 points  Count back from 20 0 points  Months in reverse 0 points  Repeat phrase 2 points  Total Score 6    Immunizations Immunization History  Administered Date(s) Administered   Fluad Quad(high Dose 65+) 02/22/2019   Influenza,inj,Quad PF,6+ Mos 04/11/2013   Influenza-Unspecified 03/22/2021   Janssen (J&J) SARS-COV-2 Vaccination 10/02/2019    TDAP status: Up to date  Flu Vaccine status: Up to date  Pneumococcal vaccine status: Up to date  Covid-19 vaccine status: Completed vaccines  Qualifies for Shingles Vaccine? Yes   Zostavax completed Yes   Shingrix Completed?: Yes  Screening Tests Health Maintenance  Topic Date Due   Pneumonia Vaccine 35+ Years old (1 - PCV) Never done   TETANUS/TDAP  Never done   Zoster Vaccines- Shingrix (1 of 2) Never done   COVID-19 Vaccine (2 - Booster for [employer] series) 11/27/2019   INFLUENZA VACCINE  Completed   HPV VACCINES  Aged Out    Health Maintenance  Health Maintenance Due  Topic Date Due   Pneumonia Vaccine 48+ Years old (1 - PCV) Never done   TETANUS/TDAP  Never done   Zoster Vaccines- Shingrix (1 of 2) Never done   COVID-19 Vaccine (2 - Booster for Janssen series) 11/27/2019    Colorectal cancer screening: No longer required.   Lung Cancer Screening: (Low Dose CT Chest recommended if Age 63-80 years, 30 pack-year currently smoking OR have quit w/in 15years.) does not qualify.   Additional Screening:  Hepatitis C Screening: does not qualify  Vision Screening: Recommended annual ophthalmology exams for early detection of glaucoma and other disorders of the eye. Is the patient up to date with their annual eye exam?  Yes  Who is the provider or what is the name of the office in which the patient attends [facility] If pt is not established with a provider, would they like to be referred to a provider to establish care? No .   Dental Screening: Recommended annual dental exams for proper  oral hygiene  Community Resource Referral / Chronic Care Management: CRR required this visit?  No   CCM required this visit?  No      Plan:  I have personally reviewed and noted the following in the patient's chart:   Medical and social history Use of alcohol, tobacco or illicit drugs  Current medications and supplements including opioid prescriptions. Patient is not currently taking opioid prescriptions. Functional ability and status Nutritional status Physical activity Advanced directives List of other physicians Hospitalizations, surgeries, and ER visits in previous 12 months Vitals Screenings to include cognitive, depression, and falls Referrals and appointments  In addition, I have reviewed and discussed with patient certain preventive protocols, quality metrics, and best practice recommendations. A written personalized care plan for preventive services as well as general preventive health recommendations were provided to patient.     [place] Constable, LPN   [id number]   Nurse Notes: screenings and vaccines completed at [facility] clinics - asked patient to bring records if possible to be scanned into chart

## 2021-06-10 ENCOUNTER — Encounter: Payer: Self-pay | Admitting: Family Medicine

## 2021-06-10 ENCOUNTER — Ambulatory Visit (INDEPENDENT_AMBULATORY_CARE_PROVIDER_SITE_OTHER): Payer: Medicare HMO | Admitting: Family Medicine

## 2021-06-10 ENCOUNTER — Ambulatory Visit (INDEPENDENT_AMBULATORY_CARE_PROVIDER_SITE_OTHER): Payer: Medicare HMO

## 2021-06-10 VITALS — BP 127/80 | HR 64 | Temp 97.2°F | Ht 67.0 in | Wt 148.4 lb

## 2021-06-10 DIAGNOSIS — J189 Pneumonia, unspecified organism: Secondary | ICD-10-CM | POA: Diagnosis not present

## 2021-06-10 DIAGNOSIS — N400 Enlarged prostate without lower urinary tract symptoms: Secondary | ICD-10-CM | POA: Insufficient documentation

## 2021-06-10 NOTE — Progress Notes (Signed)
Subjective:  Patient ID: Wesley Nguyen, male    DOB: 04/24/1932, 87 y.o.   MRN: [id number]  Patient Care Team: Sonny Masters, FNP as PCP - General (Family Medicine)   Chief Complaint:  Follow-up (CXR for pneumonia/)   HPI: Wesley Nguyen is a 87 y.o. male presenting on 06/10/2021 for Follow-up (CXR for pneumonia/)   Patient presents today for follow-up of community-acquired pneumonia.  Wesley Nguyen was diagnosed on 04/29/2021 and treated with azithromycin and Augmentin.  Wesley Nguyen has completed courses of antibiotic and reports feeling much better.  [facility] and reports all [facility] are normal.  Those labs have been requested.  Wesley Nguyen denies any shortness of breath, cough, fever, chills, weakness, confusion, or sputum production.  States Wesley Nguyen is doing well.    Relevant past medical, surgical, family, and social history reviewed and updated as indicated.  Allergies and medications reviewed and updated. Data reviewed: Chart in Epic.   Past Medical History:  Diagnosis Date   BPH (benign prostatic hyperplasia)     Past Surgical History:  Procedure Laterality Date   ESOPHAGOGASTRODUODENOSCOPY (EGD) WITH PROPOFOL N/A 08/31/2019   Procedure: ESOPHAGOGASTRODUODENOSCOPY (EGD) WITH PROPOFOL;  Surgeon: Corbin Ade, MD;  [facility];  Service: Endoscopy;  Laterality: N/A;   EXTERNAL EAR SURGERY Right 05/2019   For skin cancer   JOINT REPLACEMENT     knee    Social History   Socioeconomic History   Marital status: Married    Spouse name: Not on file   Number of children: Not on file   Years of education: Not on file   Highest education level: Not on file  Occupational History   Occupation: Retired    Comment: Veteran  Tobacco Use   Smoking status: Former    Packs/day: 3.00    Types: Cigarettes    Start date: 01/23/1949    Quit date: 06/23/1963    Years since quitting: 58.0   Smokeless tobacco: Never  Vaping Use   Vaping Use: Never used  Substance  and Sexual Activity   Alcohol use: No    Alcohol/week: 0.0 standard drinks   Drug use: No   Sexual activity: Not Currently  Other Topics Concern   Not on file  Social History Narrative   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   Physically and socially active - still very independent 05/23/21   Social Determinants of Corporate investment banker Strain: Low Risk    Difficulty of Paying Living Expenses: Not very hard  Food Insecurity: No Food Insecurity   Worried About Programme researcher, broadcasting/film/video in the Last Year: Never true   Barista in the Last Year: Never true  Transportation Needs: No Transportation Needs   Lack of Transportation (Medical): No   Lack of Transportation (Non-Medical): No  Physical Activity: Sufficiently Active   Days of Exercise per Week: 7 days   Minutes of Exercise per Session: 40 min  Stress: No Stress Concern Present   Feeling of Stress : Not at all  Social Connections: Socially Integrated   Frequency of Communication with Friends and Family: More than three times a week   Frequency of Social Gatherings with Friends and Family: More than three times a week   Attends Religious Services: More than 4 times per year   Active Member of [employer] or Organizations: Yes   Attends Banker Meetings: More than  4 times per year   Marital Status: Married  Catering manager Violence: Not At Risk   Fear of Current or Ex-Partner: No   Emotionally Abused: No   Physically Abused: No   Sexually Abused: No    Outpatient Encounter Medications as of 06/10/2021  Medication Sig   tamsulosin (FLOMAX) 0.4 MG CAPS capsule 1 capsule Orally Once a day   pantoprazole (PROTONIX) 40 MG tablet Take 1 tablet (40 mg total) by mouth daily.   No facility-administered encounter medications on file as of 06/10/2021.    Allergies  Allergen Reactions   Naproxen Sodium    Bactrim [Sulfamethoxazole-Trimethoprim] Rash    Review of Systems  Constitutional:   Negative for activity change, appetite change, chills, fatigue and fever.  HENT: Negative.    Eyes: Negative.   Respiratory:  Negative for cough, chest tightness, shortness of breath and wheezing.   Cardiovascular:  Negative for chest pain, palpitations and leg swelling.  Gastrointestinal:  Negative for abdominal pain, blood in stool, constipation, diarrhea, nausea and vomiting.  Endocrine: Negative.   Genitourinary:  Negative for decreased urine volume, dysuria, frequency and urgency.  Musculoskeletal:  Negative for arthralgias and myalgias.  Skin: Negative.   Allergic/Immunologic: Negative.   Neurological:  Negative for dizziness, weakness and headaches.  Hematological: Negative.   Psychiatric/Behavioral:  Negative for confusion, hallucinations, sleep disturbance and suicidal ideas.   All other systems reviewed and are negative.      Objective:  BP 127/80    Pulse 64    Temp (!) 97.2 F (36.2 C)    Ht 5\' 7"  (1.702 m)    Wt 148 lb 6.4 oz (67.3 kg)    SpO2 97%    BMI 23.24 kg/m    Wt Readings from Last 3 Encounters:  06/10/21 148 lb 6.4 oz (67.3 kg)  05/23/21 145 lb (65.8 kg)  05/13/21 150 lb (68 kg)    Physical Exam Constitutional:      General: Wesley Nguyen is not in acute distress.    Appearance: Normal appearance. Wesley Nguyen is normal weight. Wesley Nguyen is not ill-appearing, toxic-appearing or diaphoretic.  HENT:     Head: Normocephalic and atraumatic.     Mouth/Throat:     Mouth: Mucous membranes are moist.  Cardiovascular:     Rate and Rhythm: Normal rate and regular rhythm.     Heart sounds: Normal heart sounds. No murmur heard.   No friction rub. No gallop.  Pulmonary:     Effort: Pulmonary effort is normal. No respiratory distress.     Breath sounds: Normal breath sounds. No stridor. No wheezing, rhonchi or rales.  Chest:     Chest wall: No tenderness.  Skin:    General: Skin is warm and dry.     Capillary Refill: Capillary refill takes less than 2 seconds.  Neurological:      General: No focal deficit present.     Mental Status: Wesley Nguyen is alert and oriented to person, place, and time.    Results for orders placed or performed in visit on 09/20/19  CBC with Differential/Platelet  Result Value Ref Range   WBC 8.2 3.8 - 10.8 Thousand/uL   RBC 4.67 4.20 - 5.80 Million/uL   Hemoglobin 13.7 13.2 - 17.1 g/dL   HCT 09/22/19 13.2 - 44.0 %   MCV 90.1 80.0 - 100.0 fL   MCH 29.3 27.0 - 33.0 pg   MCHC 32.5 32.0 - 36.0 g/dL   RDW 10.2 72.5 - 36.6 %   Platelets 296 140 -  400 Thousand/uL   MPV 9.8 7.5 - 12.5 fL   Neutro Abs 5,092 1,500 - 7,800 cells/uL   Lymphs Abs 2,189 850 - 3,900 cells/uL   Absolute Monocytes 697 200 - 950 cells/uL   Eosinophils Absolute 180 15 - 500 cells/uL   Basophils Absolute 41 0 - 200 cells/uL   Neutrophils Relative % 62.1 %   Total Lymphocyte 26.7 %   Monocytes Relative 8.5 %   Eosinophils Relative 2.2 %   Basophils Relative 0.5 %     X-Ray: CXR: Improving LLL consolidation. No acute findings. Preliminary x-ray reading by Kari Baars, FNP-C, [facility].   Pertinent labs & imaging results that were available during my care of the patient were reviewed by me and considered in my medical decision making.  Assessment & Plan:  Kasen was seen today for follow-up.  Diagnoses and all orders for this visit:  Community acquired pneumonia of left lower lobe of lung X-ray revealed improvement in left lower lobe consolidation.  Patient asymptomatic.  Will notify patient if radiology reading differs. -     DG Chest 2 View; Future     Continue all other maintenance medications.  Follow up plan: Return in about 1 year (around 06/10/2022), or if symptoms worsen or fail to improve.   Continue healthy lifestyle choices, including diet (rich in fruits, vegetables, and lean proteins, and low in salt and simple carbohydrates) and exercise (at least 30 minutes of moderate physical activity daily).   The above assessment and management plan was discussed  with the patient. The patient verbalized understanding of and has agreed to the management plan. Patient is aware to call the clinic if they develop any new symptoms or if symptoms persist or worsen. Patient is aware when to return to the clinic for a follow-up visit. Patient educated on when it is appropriate to go to the emergency department.   Kari Baars, FNP-C [facility] [phone]

## 2022-04-22 ENCOUNTER — Encounter: Payer: Self-pay | Admitting: Family Medicine

## 2022-04-22 ENCOUNTER — Ambulatory Visit (INDEPENDENT_AMBULATORY_CARE_PROVIDER_SITE_OTHER): Payer: Medicare HMO | Admitting: Family Medicine

## 2022-04-22 VITALS — BP 121/77 | HR 64 | Temp 97.4°F | Ht 67.0 in | Wt 147.4 lb

## 2022-04-22 DIAGNOSIS — H7112 Cholesteatoma of tympanum, left ear: Secondary | ICD-10-CM

## 2022-04-22 DIAGNOSIS — Z23 Encounter for immunization: Secondary | ICD-10-CM | POA: Diagnosis not present

## 2022-04-22 NOTE — Progress Notes (Addendum)
Subjective:    Patient ID: Wesley Nguyen, male    DOB: 02/02/1932, 88 y.o.   MRN: [id number]   Chief Complaint: trouble hearing (Left - Patient states that his skin is dry in his ear ) and Nasal Congestion (Patient states he has been having drainage x 3 weeks but has gotten better )   Pt seen today for c/o trouble hearing in L ear even with hearing aid in; pt reports he has dry skin in his ear and he "gets scaling in it" and he cannot get it out; had hearing aids readjusted and still having trouble hearing in L ear. Pt also reports some nasal congestion last few weeks which has now resolved.       Review of Systems  Constitutional:  Negative for chills and fever.  HENT:  Negative for congestion, ear discharge, ear pain, rhinorrhea, sore throat and tinnitus.   Respiratory:  Negative for cough, chest tightness and shortness of breath.   Cardiovascular:  Negative for chest pain.  Neurological:  Negative for dizziness and light-headedness.  All other systems reviewed and are negative.      Objective:   Physical Exam Vitals and nursing note reviewed.  Constitutional:      General: He is not in acute distress.    Appearance: Normal appearance. He is well-developed. He is not ill-appearing.  HENT:     Head: Normocephalic and atraumatic.     Right Ear: Tympanic membrane, ear canal and external ear normal.     Left Ear: External ear normal. Decreased hearing noted. No drainage or tenderness. There is no impacted cerumen. No mastoid tenderness.     Ears:     Comments: White opaque area noted behind TM in L ear    Nose: Nose normal.     Mouth/Throat:     Mouth: Mucous membranes are moist.     Pharynx: Oropharynx is clear.  Cardiovascular:     Rate and Rhythm: Normal rate and regular rhythm.  Pulmonary:     Effort: Pulmonary effort is normal. No respiratory distress.     Breath sounds: Normal breath sounds. No wheezing, rhonchi or rales.  Lymphadenopathy:     Head:     Right  side of head: No preauricular or posterior auricular adenopathy.     Left side of head: No preauricular or posterior auricular adenopathy.     Cervical: No cervical adenopathy.  Skin:    General: Skin is warm and dry.  Neurological:     General: No focal deficit present.     Mental Status: He is alert and oriented to person, place, and time.  Psychiatric:        Mood and Affect: Mood normal.        Behavior: Behavior normal.     BP 121/77   Pulse 64   Temp (!) 97.4 F (36.3 C) (Temporal)   Ht 5\' 7"  (1.702 m)   Wt 147 lb 6.4 oz (66.9 kg)   SpO2 97%   BMI 23.09 kg/m        Assessment & Plan:   [name] in today with chief complaint of trouble hearing (Left - Patient states that his skin is dry in his ear ) and Nasal Congestion (Patient states he has been having drainage x 3 weeks but has gotten better )   1. Cholesteatoma of tympanum of left ear Noted trouble hearing in L ear; ENT referral placed. - Ambulatory referral to ENT    The above  assessment and management plan was discussed with the patient. The patient verbalized understanding of and has agreed to the management plan. Patient is aware to call the clinic if symptoms persist or worsen. Patient is aware when to return to the clinic for a follow-up visit. Patient educated on when it is appropriate to go to the emergency department.   Consuello Bossier, FNP student   I personally was present during the history, physical exam, and medical decision-making activities of this visit and have verified that the services and findings are accurately documented in the nurse practitioner student's note.  Kari Baars, FNP-C [facility] [address] [place], [place] [address] [phone]

## 2022-05-28 ENCOUNTER — Ambulatory Visit (INDEPENDENT_AMBULATORY_CARE_PROVIDER_SITE_OTHER): Payer: Medicare HMO

## 2022-05-28 DIAGNOSIS — Z0000 Encounter for general adult medical examination without abnormal findings: Secondary | ICD-10-CM

## 2022-05-28 NOTE — Progress Notes (Signed)
Subjective:   Wesley Nguyen is a 88 y.o. male who presents for Medicare Annual/Subsequent preventive examination.  I connected with  Wesley Dagoichard Crumpler on 05/28/22 by a audio enabled telemedicine application and verified that I am speaking with the correct person using two identifiers.  Patient Location: Home  Provider Location: Office/Clinic  I discussed the limitations of evaluation and management by telemedicine. The patient expressed understanding and agreed to proceed.   Review of Systems    Defer to PCP  Cardiac Risk Factors include: advanced age (>2355men, 90>65 women);male gender     Objective:    There were no vitals filed for this visit. There is no height or weight on file to calculate BMI.     05/28/2022    9:15 AM 05/23/2021   10:14 AM 08/31/2019    6:43 AM 08/30/2019   11:51 PM 04/11/2019    3:10 PM  Advanced Directives  Does Patient Have a Medical Advance Directive? Yes Yes No;Yes Yes No  Type of Advance Directive Living will Healthcare Power of HarvestAttorney;Living will Healthcare Power of [place]orney;Out of facility DNR (pink MOST or yellow form) Healthcare Power of Attorney   Does patient want to make changes to medical advance directive? No - Patient declined   No - Patient declined   Copy of Healthcare Power of Attorney in Chart?  No - copy requested No - copy requested No - copy requested   Would patient like information on creating a medical advance directive?   No - Patient declined  No - Patient declined    Current Medications (verified) Outpatient Encounter Medications as of 05/28/2022  Medication Sig   glucosamine-chondroitin 500-400 MG tablet Take 1 tablet by mouth 3 (three) times daily.   Multiple Vitamin (MULTIVITAMIN) tablet Take 1 tablet by mouth daily.   pantoprazole (PROTONIX) 40 MG tablet Take 1 tablet (40 mg total) by mouth daily.   tamsulosin (FLOMAX) 0.4 MG CAPS capsule 1 capsule Orally Once a day   No facility-administered encounter medications on  file as of 05/28/2022.    Allergies (verified) Naproxen sodium and Bactrim [sulfamethoxazole-trimethoprim]   History: Past Medical History:  Diagnosis Date   BPH (benign prostatic hyperplasia)    Past Surgical History:  Procedure Laterality Date   CATARACT EXTRACTION     ESOPHAGOGASTRODUODENOSCOPY (EGD) WITH PROPOFOL N/A 08/31/2019   Procedure: ESOPHAGOGASTRODUODENOSCOPY (EGD) WITH PROPOFOL;  Surgeon: Corbin Adeourk, Robert M, MD;  [facility];  Service: Endoscopy;  Laterality: N/A;   EXTERNAL EAR SURGERY Right 05/2019   For skin cancer   JOINT REPLACEMENT     knee   Family History  Problem Relation Age of Onset   Cancer Mother        colon   Macular degeneration Mother    Heart disease Father    Dementia Father    Colon cancer Neg Hx    Social History   Socioeconomic History   Marital status: Widowed    Spouse name: Not on file   Number of children: 6   Years of education: 1112   Highest education level: 12th grade  Occupational History   Occupation: Retired    Comment: Veteran  Tobacco Use   Smoking status: Former    Packs/day: 3.00    Years: 3.00    Total pack years: 9.00    Types: Cigarettes    Start date: 01/23/1949    Quit date: 06/23/1963    Years since quitting: 58.9   Smokeless tobacco: Never  Vaping Use  Vaping Use: Never used  Substance and Sexual Activity   Alcohol use: No    Alcohol/week: 0.0 standard drinks of alcohol   Drug use: No   Sexual activity: Not Currently  Other Topics Concern   Not on file  Social History Narrative   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   Social Determinants of Health   Financial Resource Strain: Low Risk  (05/28/2022)   Overall Financial Resource Strain (CARDIA)    Difficulty of Paying Living Expenses: Not very hard  Food Insecurity: No Food Insecurity (05/28/2022)   Hunger Vital Sign    Worried About Running Out of Food in the Last Year: Never true    Ran Out of Food in the Last Year:  Never true  Transportation Needs: No Transportation Needs (05/28/2022)   PRAPARE - Administrator, Civil Service (Medical): No    Lack of Transportation (Non-Medical): No  Physical Activity: Sufficiently Active (05/28/2022)   Exercise Vital Sign    Days of Exercise per Week: 7 days    Minutes of Exercise per Session: 30 min  Stress: No Stress Concern Present (05/28/2022)   [employer] of Occupational Health - Occupational Stress Questionnaire    Feeling of Stress : Not at all  Social Connections: Moderately Integrated (05/28/2022)   Social Connection and Isolation Panel [NHANES]    Frequency of Communication with Friends and Family: Once a week    Frequency of Social Gatherings with Friends and Family: More than three times a week    Attends Religious Services: More than 4 times per year    Active Member of [employer] or Organizations: Yes    Attends Banker Meetings: More than 4 times per year    Marital Status: Widowed    Tobacco Counseling Counseling given: Not Answered   Clinical Intake:  Pre-visit preparation completed: Yes  Pain : No/denies pain     Nutritional Risks: None Diabetes: No  How often do you need to have someone help you when you read instructions, pamphlets, or other written materials from your doctor or pharmacy?: 1 - Never What is the last grade level you completed in school?: GED  Diabetic? No     Information entered by :: Mariam Dollar LPN   Activities of Daily Living    05/28/2022    9:16 AM  In your present state of health, do you have any difficulty performing the following activities:  Hearing? 1  Comment wears hearing aids  Vision? 0  Difficulty concentrating or making decisions? 1  Comment has noticed some decline in memory  Walking or climbing stairs? 0  Dressing or bathing? 0  Doing errands, shopping? 0  Preparing Food and eating ? N  Using the Toilet? N  In the past six months, have you accidently leaked  urine? N  Do you have problems with loss of bowel control? N  Managing your Medications? Y  Comment daughter organizes medications  Managing your Finances? N  Housekeeping or managing your Housekeeping? Y  Comment daughter takes care of housekeeping inside, he does some outside work    Patient Care Team: Rakes, Doralee Albino, FNP as PCP - General (Family Medicine)  Indicate any recent Medical Services you may have received from other than [facility] providers in the past year (date may be approximate).     Assessment:   This is a routine wellness examination for Sohrab.  Hearing/Vision screen No results found.  Dietary issues  and exercise activities discussed: Current Exercise Habits: Home exercise routine, Type of exercise: walking;Other - see comments (rides stationary bike daily), Time (Minutes): 30, Frequency (Times/Week): 7, Weekly Exercise (Minutes/Week): 210, Intensity: Moderate, Exercise limited by: None identified   Goals Addressed             This Visit's Progress    Patient Stated       05/28/2022 AWV Goal: Fall Prevention  Over the next year, patient will decrease their risk for falls by: Using assistive devices, such as a cane or walker, as needed Identifying fall risks within their home and correcting them by: Removing throw rugs Adding handrails to stairs or ramps Removing clutter and keeping a clear pathway throughout the home Increasing light, especially at night Adding shower handles/bars Raising toilet seat Identifying potential personal risk factors for falls: Medication side effects Incontinence/urgency Vestibular dysfunction Hearing loss Musculoskeletal disorders Neurological disorders Orthostatic hypotension         Depression Screen    05/28/2022    9:14 AM 04/22/2022   10:37 AM 06/10/2021    3:07 PM 05/23/2021   10:13 AM 05/13/2021    3:11 PM  PHQ 2/9 Scores  PHQ - 2 Score 0 0 0 0 0  PHQ- 9 Score  1  2 2     Fall Risk    05/28/2022     9:21 AM 04/22/2022   10:37 AM 06/10/2021    3:07 PM 05/23/2021   10:13 AM 05/13/2021    3:12 PM  Fall Risk   Falls in the past year? 0 0 1 1 1   Number falls in past yr:   1 1 0  Injury with Fall?   1 1 1   Risk for fall due to :   History of fall(s) History of fall(s)   Follow up Falls evaluation completed  Falls prevention discussed;Education provided Falls prevention discussed;Education provided     FALL RISK PREVENTION PERTAINING TO THE HOME:  Any stairs in or around the home? Yes  If so, are there any without handrails? Yes  Home free of loose throw rugs in walkways, pet beds, electrical cords, etc? Yes  Adequate lighting in your home to reduce risk of falls? Yes   ASSISTIVE DEVICES UTILIZED TO PREVENT FALLS:  Life alert? No  Use of a cane, walker or w/c? No  Grab bars in the bathroom? Yes  Shower chair or bench in shower? No  Elevated toilet seat or a handicapped toilet? No   TIMED UP AND GO:  Was the test performed? No .    Cognitive Function:        05/28/2022    9:18 AM 05/23/2021    9:57 AM  6CIT Screen  What Year? 0 points 4 points  What month? 0 points 0 points  What time? 0 points 0 points  Count back from 20 0 points 0 points  Months in reverse 0 points 0 points  Repeat phrase 4 points 2 points  Total Score 4 points 6 points    Immunizations Immunization History  Administered Date(s) Administered   Fluad Quad(high Dose 65+) 02/22/2019, 04/22/2022   Influenza,inj,Quad PF,6+ Mos 04/11/2013   Influenza-Unspecified 03/22/2021   Janssen (J&J) SARS-COV-2 Vaccination 10/02/2019   Pneumococcal Polysaccharide-23 06/06/2021    TDAP status: Up to date  Flu Vaccine status: Up to date  Pneumococcal vaccine status: Up to date  Covid-19 vaccine status: Information provided on how to obtain vaccines.   Qualifies for Shingles Vaccine? Yes  Zostavax completed No   Shingrix Completed?: No.    Education has been provided regarding the importance of this  vaccine. Patient has been advised to call insurance company to determine out of pocket expense if they have not yet received this vaccine. Advised may also receive vaccine at local pharmacy or Health Dept. Verbalized acceptance and understanding.  Screening Tests Health Maintenance  Topic Date Due   DTaP/Tdap/Td (1 - Tdap) Never done   Zoster Vaccines- Shingrix (1 of 2) Never done   COVID-19 Vaccine (2 - 2023-24 season) 02/20/2022   Medicare Annual Wellness (AWV)  05/23/2022   Pneumonia Vaccine 2+ Years old (2 - PCV) 06/06/2022   INFLUENZA VACCINE  Completed   HPV VACCINES  Aged Out    Health Maintenance  Health Maintenance Due  Topic Date Due   DTaP/Tdap/Td (1 - Tdap) Never done   Zoster Vaccines- Shingrix (1 of 2) Never done   COVID-19 Vaccine (2 - 2023-24 season) 02/20/2022   Medicare Annual Wellness (AWV)  05/23/2022   Pneumonia Vaccine 94+ Years old (2 - PCV) 06/06/2022    Colorectal cancer screening: No longer required.   Lung Cancer Screening: (Low Dose CT Chest recommended if Age 54-80 years, 30 pack-year currently smoking OR have quit w/in 15years.) does not qualify.   Lung Cancer Screening Referral: N/A  Additional Screening:  Hepatitis C Screening: does not qualify  Vision Screening: Recommended annual ophthalmology exams for early detection of glaucoma and other disorders of the eye. Is the patient up to date with their annual eye exam?  Yes  Who is the provider or what is the name of the office in which the patient attends annual eye exams?  [facility] [place] If pt is not established with a provider, would they like to be referred to a provider to establish care?  N/A .   Dental Screening: Recommended annual dental exams for proper oral hygiene  Community Resource Referral / Chronic Care Management: CRR required this visit?  No   CCM required this visit?  No      Plan:     I have personally reviewed and noted the following in the patient's  chart:   Medical and social history Use of alcohol, tobacco or illicit drugs  Current medications and supplements including opioid prescriptions. Patient is not currently taking opioid prescriptions. Functional ability and status Nutritional status Physical activity Advanced directives List of other physicians Hospitalizations, surgeries, and ER visits in previous 12 months Vitals Screenings to include cognitive, depression, and falls Referrals and appointments  In addition, I have reviewed and discussed with patient certain preventive protocols, quality metrics, and best practice recommendations. A written personalized care plan for preventive services as well as general preventive health recommendations were provided to patient.     Mariam Dollar, LPN      [id number]/06/5828

## 2022-05-28 NOTE — Patient Instructions (Signed)
  Mr. Wesley Nguyen , Thank you for taking time to come for your Medicare Wellness Visit. I appreciate your ongoing commitment to your health goals. Please review the following plan we discussed and let me know if I can assist you in the future.   These are the goals we discussed:  Goals      DIET - INCREASE WATER INTAKE     Patient Stated     05/28/2022 AWV Goal: Fall Prevention  Over the next year, patient will decrease their risk for falls by: Using assistive devices, such as a cane or walker, as needed Identifying fall risks within their home and correcting them by: Removing throw rugs Adding handrails to stairs or ramps Removing clutter and keeping a clear pathway throughout the home Increasing light, especially at night Adding shower handles/bars Raising toilet seat Identifying potential personal risk factors for falls: Medication side effects Incontinence/urgency Vestibular dysfunction Hearing loss Musculoskeletal disorders Neurological disorders Orthostatic hypotension          This is a list of the screening recommended for you and due dates:  Health Maintenance  Topic Date Due   DTaP/Tdap/Td vaccine (1 - Tdap) Never done   Zoster (Shingles) Vaccine (1 of 2) Never done   COVID-19 Vaccine (2 - 2023-24 season) 02/20/2022   Medicare Annual Wellness Visit  05/23/2022   Pneumonia Vaccine (2 - PCV) 06/06/2022   Flu Shot  Completed   HPV Vaccine  Aged Out

## 2022-07-02 ENCOUNTER — Telehealth (INDEPENDENT_AMBULATORY_CARE_PROVIDER_SITE_OTHER): Payer: Medicare HMO | Admitting: Family Medicine

## 2022-07-02 ENCOUNTER — Encounter: Payer: Self-pay | Admitting: Family Medicine

## 2022-07-02 DIAGNOSIS — R509 Fever, unspecified: Secondary | ICD-10-CM | POA: Diagnosis not present

## 2022-07-02 DIAGNOSIS — R059 Cough, unspecified: Secondary | ICD-10-CM | POA: Diagnosis not present

## 2022-07-02 DIAGNOSIS — R6889 Other general symptoms and signs: Secondary | ICD-10-CM | POA: Diagnosis not present

## 2022-07-02 NOTE — Progress Notes (Signed)
Virtual Visit via MyChart Video Note Due to COVID-19 pandemic this visit was conducted virtually. This visit type was conducted due to national recommendations for restrictions regarding the COVID-19 Pandemic (e.g. social distancing, sheltering in place) in an effort to limit this patient's exposure and mitigate transmission in our community. All issues noted in this document were discussed and addressed.  A physical exam was not performed with this format.   I connected with Wesley Nguyen and his wife on 07/02/2022 at 1315 by MyChart Video and verified that I am speaking with the correct person using two identifiers. Sonya Pucci is currently located at home and family is currently with them during visit. The provider, Monia Pouch, FNP is located in their office at time of visit.  I discussed the limitations, risks, security and privacy concerns of performing an evaluation and management service by virtual visit and the availability of in person appointments. I also discussed with the patient that there may be a patient responsible charge related to this service. The patient expressed understanding and agreed to proceed.  Subjective:  Patient ID: Wesley Nguyen, male    DOB: 06-15-1932, 89 y.o.   MRN: [id number]  Chief Complaint:  URI   HPI: Wesley Nguyen is a 89 y.o. male presenting on 07/02/2022 for URI   Pt and wife reports cough, congestion, fever, chills, malaise, rhinorrhea, and myalgias which started yesterday. Has not been tested for COVID or influenza. Has been taking Tylenol and Benadryl for symptom management.   URI  This is a new problem. The current episode started yesterday. The problem has been unchanged. The maximum temperature recorded prior to his arrival was 100.4 - 100.9 F. Associated symptoms include congestion, coughing and rhinorrhea. Pertinent negatives include no abdominal pain, chest pain, diarrhea, dysuria, ear pain, headaches, joint pain, joint swelling, nausea,  neck pain, plugged ear sensation, rash, sinus pain, sneezing, sore throat, swollen glands, vomiting or wheezing. He has tried acetaminophen for the symptoms. The treatment provided mild relief.     Relevant past medical, surgical, family, and social history reviewed and updated as indicated.  Allergies and medications reviewed and updated.   Past Medical History:  Diagnosis Date   BPH (benign prostatic hyperplasia)     Past Surgical History:  Procedure Laterality Date   CATARACT EXTRACTION     ESOPHAGOGASTRODUODENOSCOPY (EGD) WITH PROPOFOL N/A 08/31/2019   Procedure: ESOPHAGOGASTRODUODENOSCOPY (EGD) WITH PROPOFOL;  Surgeon: Daneil Dolin, MD;  [facility];  Service: Endoscopy;  Laterality: N/A;   EXTERNAL EAR SURGERY Right 05/2019   For skin cancer   JOINT REPLACEMENT     knee    Social History   Socioeconomic History   Marital status: Widowed    Spouse name: Not on file   Number of children: 6   Years of education: 68   Highest education level: 12th grade  Occupational History   Occupation: Retired    Comment: Veteran  Tobacco Use   Smoking status: Former    Packs/day: 3.00    Years: 3.00    Total pack years: 9.00    Types: Cigarettes    Start date: 01/23/1949    Quit date: 06/23/1963    Years since quitting: 59.0   Smokeless tobacco: Never  Vaping Use   Vaping Use: Never used  Substance and Sexual Activity   Alcohol use: No    Alcohol/week: 0.0 standard drinks of alcohol   Drug use: No   Sexual activity: Not Currently  Other Topics Concern  Not on file  Social History Narrative   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 - 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   Social Determinants of Health   Financial Resource Strain: Low Risk  (05/28/2022)   Overall Financial Resource Strain (CARDIA)    Difficulty of Paying Living Expenses: Not very hard  Food Insecurity: No Food Insecurity (05/28/2022)   Hunger Vital Sign    Worried About Running Out of Food in the Last Year:  Never true    [place] in the Last Year: Never true  Transportation Needs: No Transportation Needs (05/28/2022)   PRAPARE - Hydrologist (Medical): No    Lack of Transportation (Non-Medical): No  Physical Activity: Sufficiently Active (05/28/2022)   Exercise Vital Sign    Days of Exercise per Week: 7 days    Minutes of Exercise per Session: 30 min  Stress: No Stress Concern Present (05/28/2022)   [place]    Feeling of Stress : Not at all  Social Connections: Moderately Integrated (05/28/2022)   Social Connection and Isolation Panel [NHANES]    Frequency of Communication with Friends and Family: Once a week    Frequency of Social Gatherings with Friends and Family: More than three times a week    Attends Religious Services: More than 4 times per year    Active Member of [employer] or Organizations: Yes    Attends Archivist Meetings: More than 4 times per year    Marital Status: Widowed  Intimate Partner Violence: Not At Risk (05/28/2022)   Humiliation, Afraid, Rape, and Kick questionnaire    Fear of Current or Ex-Partner: No    Emotionally Abused: No    Physically Abused: No    Sexually Abused: No    Outpatient Encounter Medications as of 07/02/2022  Medication Sig   glucosamine-chondroitin 500-400 MG tablet Take 1 tablet by mouth 3 (three) times daily.   Multiple Vitamin (MULTIVITAMIN) tablet Take 1 tablet by mouth daily.   pantoprazole (PROTONIX) 40 MG tablet Take 1 tablet (40 mg total) by mouth daily.   tamsulosin (FLOMAX) 0.4 MG CAPS capsule 1 capsule Orally Once a day   No facility-administered encounter medications on file as of 07/02/2022.    Allergies  Allergen Reactions   Naproxen Sodium    Bactrim [Sulfamethoxazole-Trimethoprim] Rash    Review of Systems  Constitutional:  Positive for activity change, appetite change, chills, fatigue and fever. Negative for  diaphoresis and unexpected weight change.  HENT:  Positive for congestion, postnasal drip and rhinorrhea. Negative for dental problem, drooling, ear discharge, ear pain, facial swelling, hearing loss, mouth sores, nosebleeds, sinus pressure, sinus pain, sneezing, sore throat, tinnitus, trouble swallowing and voice change.   Eyes:  Negative for photophobia and visual disturbance.  Respiratory:  Positive for cough. Negative for apnea, choking, chest tightness, shortness of breath, wheezing and stridor.   Cardiovascular:  Negative for chest pain.  Gastrointestinal:  Negative for abdominal pain, diarrhea, nausea and vomiting.  Genitourinary:  Negative for decreased urine volume, difficulty urinating and dysuria.  Musculoskeletal:  Positive for myalgias. Negative for joint pain and neck pain.  Skin:  Negative for rash.  Neurological:  Negative for dizziness, weakness and headaches.  Psychiatric/Behavioral:  Negative for confusion.   All other systems reviewed and are negative.        Observations/Objective: No vital signs or physical exam, this was a virtual health encounter.  Pt alert and  oriented, answers all questions appropriately, and able to speak in full sentences.    Assessment and Plan: Wesley Nguyen was seen today for uri.  Diagnoses and all orders for this visit:  Flu-like symptoms Reported symptoms concerning for influenza or COVID. Will test and treat as warranted. Symptomatic care discussed in detail. Aware to report any new, worsening, or persistent symptoms.  -     COVID-19, Flu A+B and RSV     Follow Up Instructions: Return if symptoms worsen or fail to improve.    I discussed the assessment and treatment plan with the patient. The patient was provided an opportunity to ask questions and all were answered. The patient agreed with the plan and demonstrated an understanding of the instructions.   The patient was advised to call back or seek an in-person evaluation if the  symptoms worsen or if the condition fails to improve as anticipated.  The above assessment and management plan was discussed with the patient. The patient verbalized understanding of and has agreed to the management plan. Patient is aware to call the clinic if they develop any new symptoms or if symptoms persist or worsen. Patient is aware when to return to the clinic for a follow-up visit. Patient educated on when it is appropriate to go to the emergency department.    I provided 15 minutes of time during this MyChart Video encounter.   Kari Baars, FNP-C [facility] [address] [place], [place] [address] [phone] 07/02/2022

## 2022-07-03 ENCOUNTER — Telehealth: Payer: Self-pay | Admitting: Family Medicine

## 2022-07-03 ENCOUNTER — Ambulatory Visit: Payer: Medicare HMO | Admitting: Family

## 2022-07-03 LAB — COVID-19, FLU A+B AND RSV
Influenza A, NAA: NOT DETECTED
Influenza B, NAA: NOT DETECTED
RSV, NAA: NOT DETECTED
SARS-CoV-2, NAA: NOT DETECTED

## 2022-07-03 NOTE — Telephone Encounter (Signed)
Aware of results. 

## 2022-07-07 ENCOUNTER — Encounter: Payer: Self-pay | Admitting: Family Medicine

## 2022-07-13 MED ORDER — AMOXICILLIN-POT CLAVULANATE 875-125 MG PO TABS: 1.0000 | ORAL_TABLET | Freq: Two times a day (BID) | ORAL | 0 refills | Status: AC

## 2022-07-13 NOTE — Telephone Encounter (Signed)
Rakes, Connye Burkitt, FNP  [facility] Clinical Pool3 hours ago (1:13 PM)    Medications sent to pharmacy  Patients daughter aware.

## 2022-07-13 NOTE — Telephone Encounter (Signed)
Pts daughter called to let PCP know that he is still sick. Wants to know if she can send in an antibiotic? Pt uses [facility].

## 2022-07-13 NOTE — Addendum Note (Signed)
Addended by: Baruch Gouty on: 07/13/2022 01:13 PM   Modules accepted: Orders

## 2022-08-12 ENCOUNTER — Ambulatory Visit (INDEPENDENT_AMBULATORY_CARE_PROVIDER_SITE_OTHER): Payer: Medicare HMO | Admitting: Family Medicine

## 2022-08-12 ENCOUNTER — Encounter: Payer: Self-pay | Admitting: Family Medicine

## 2022-08-12 VITALS — BP 124/76 | HR 69 | Temp 97.6°F | Ht 67.0 in | Wt 151.6 lb

## 2022-08-12 DIAGNOSIS — J069 Acute upper respiratory infection, unspecified: Secondary | ICD-10-CM

## 2022-08-12 DIAGNOSIS — J0100 Acute maxillary sinusitis, unspecified: Secondary | ICD-10-CM

## 2022-08-12 MED ORDER — PSEUDOEPHEDRINE-GUAIFENESIN ER 60-600 MG PO TB12: 1.0000 | ORAL_TABLET | Freq: Two times a day (BID) | ORAL | 0 refills | Status: AC

## 2022-08-12 MED ORDER — PREDNISONE 10 MG PO TABS: ORAL_TABLET | ORAL | 0 refills | Status: DC

## 2022-08-12 MED ORDER — AZITHROMYCIN 250 MG PO TABS: ORAL_TABLET | ORAL | 0 refills | Status: DC

## 2022-08-12 NOTE — Progress Notes (Signed)
Chief Complaint  Patient presents with   Cough    HPI  Patient presents today for Symptoms include congestion, facial pain, nasal congestion, non productive cough, post nasal drip and sinus pressure. There is no fever, chills, or sweats. Onset of symptoms was a few days ago, gradually worsening since that time.    PMH: Smoking status noted ROS: Per HPI  Objective: BP 124/76   Pulse 69   Temp 97.6 F (36.4 C)   Ht 5' 7"$  (1.702 m)   Wt 151 lb 9.6 oz (68.8 kg)   SpO2 99%   BMI 23.74 kg/m  Gen: NAD, alert, cooperative with exam HEENT: NCAT, EOMI, PERRL. Nasal passages swollen, red. Max sinuses tender CV: RRR, good S1/S2, no murmur Resp: CTABL, no wheezes, non-labored Ext: No edema, warm Neuro: Alert and oriented, No gross deficits  Assessment and plan:  1. Acute maxillary sinusitis, recurrence not specified   2. Upper respiratory infection with cough and congestion     Meds ordered this encounter  Medications   predniSONE (DELTASONE) 10 MG tablet    Sig: Take 5 daily for 2 days followed by 4,3,2 and 1 for 2 days each.    Dispense:  30 tablet    Refill:  0   azithromycin (ZITHROMAX Z-PAK) 250 MG tablet    Sig: Take two right away Then one a day for the next 4 days.    Dispense:  6 each    Refill:  0   pseudoephedrine-guaifenesin (MUCINEX D) 60-600 MG 12 hr tablet    Sig: Take 1 tablet by mouth every 12 (twelve) hours for 10 days. As needed for congestion    Dispense:  20 tablet    Refill:  0    Orders Placed This Encounter  Procedures   COVID-19, Flu A+B and RSV    Order Specific Question:   Previously tested for COVID-19    Answer:   Yes    Order Specific Question:   Resident in a congregate (group) care setting    Answer:   No    Order Specific Question:   Is the patient student?    Answer:   No    Order Specific Question:   Employed in healthcare setting    Answer:   No    Order Specific Question:   Has patient completed COVID vaccination(s) (2 doses of  Pfizer/Moderna 1 dose of [employer])    Answer:   Unknown    Follow up as needed.  Claretta Fraise, MD

## 2022-08-13 LAB — COVID-19, FLU A+B AND RSV
Influenza A, NAA: NOT DETECTED
Influenza B, NAA: NOT DETECTED
RSV, NAA: NOT DETECTED
SARS-CoV-2, NAA: NOT DETECTED

## 2022-08-14 NOTE — Progress Notes (Signed)
Pt rtc and results given.

## 2022-09-12 IMAGING — DX DG RIBS W/ CHEST 3+V*L*
5 series · 5 of 5 positions shown · non-contrast
Comparison: 04/11/2019

CLINICAL DATA: Fall.  Left rib pain.

EXAM:
LEFT RIBS AND CHEST - 3+ VIEW

[chest pa]
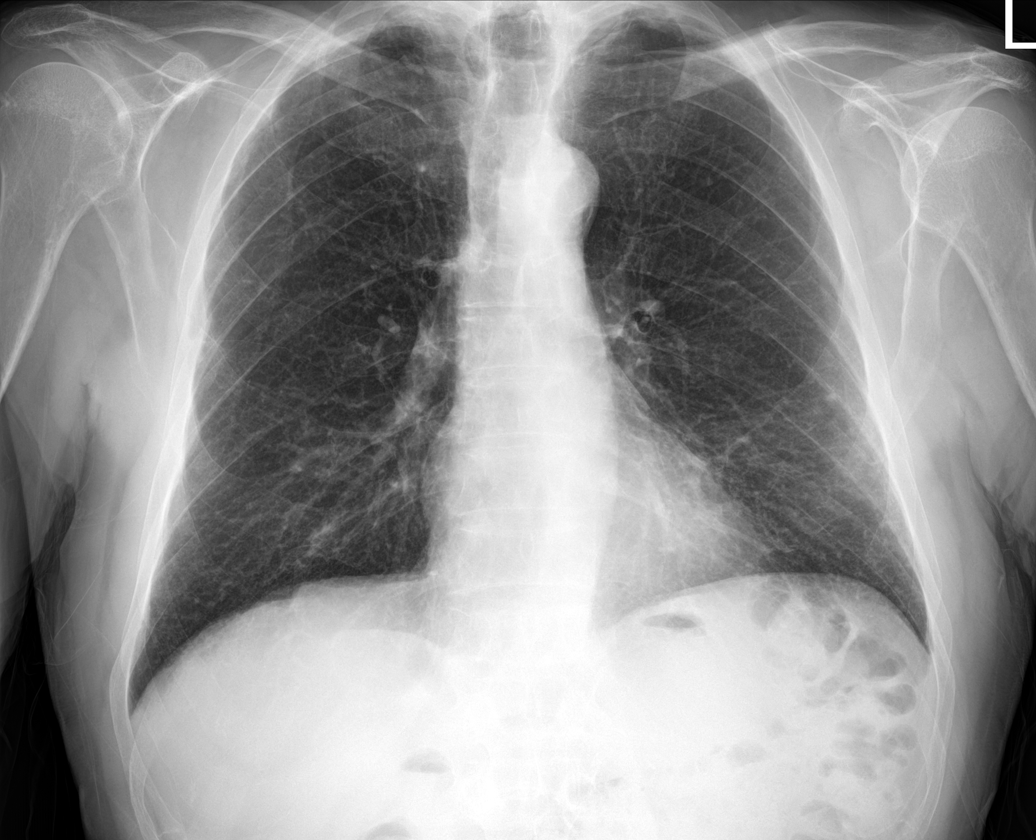

[rib obl (1 of 2)]
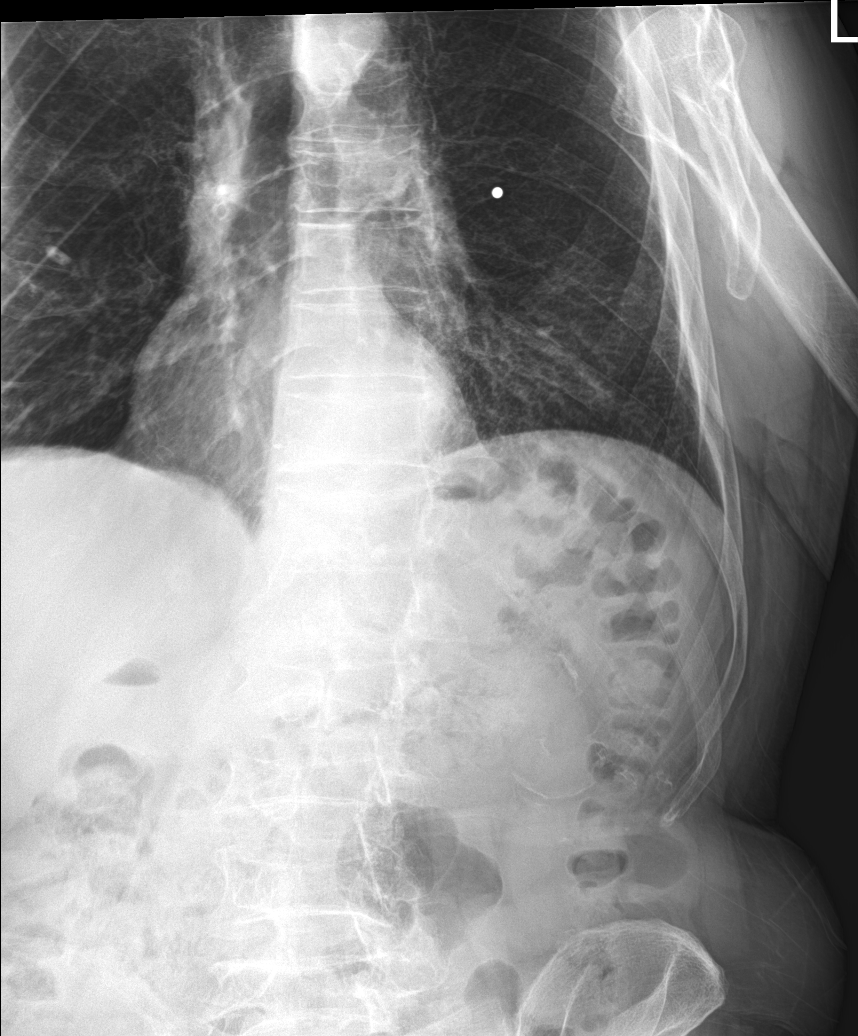

[rib obl (2 of 2)]
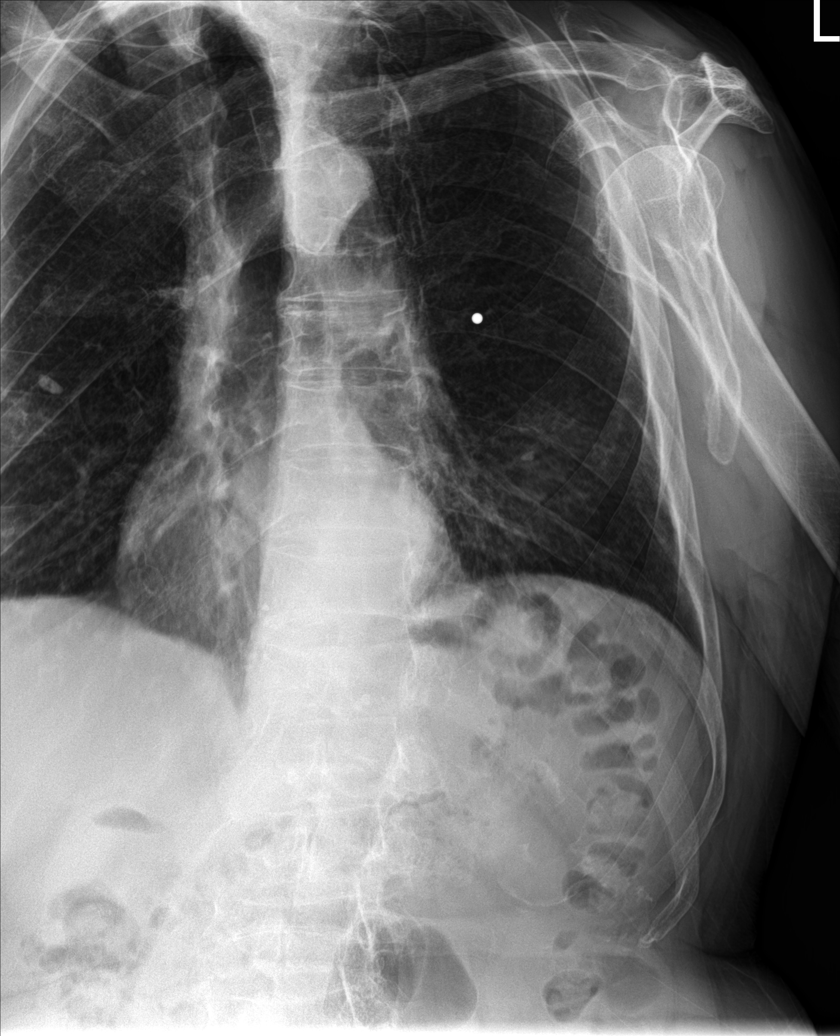

[rib pa (1 of 2)]
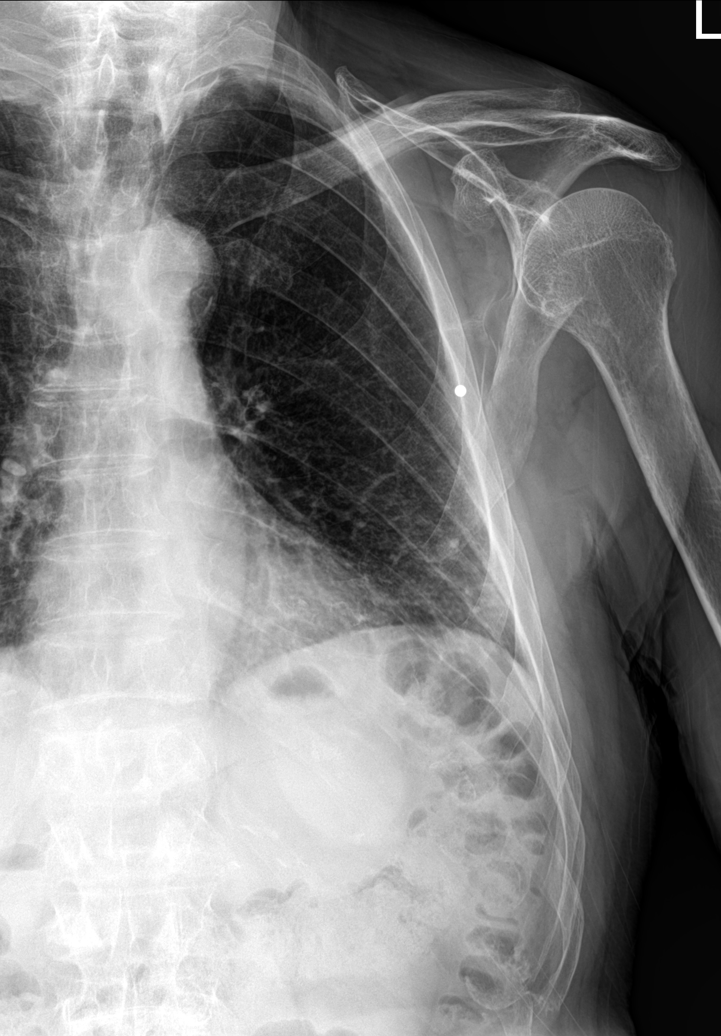

[rib pa (2 of 2)]
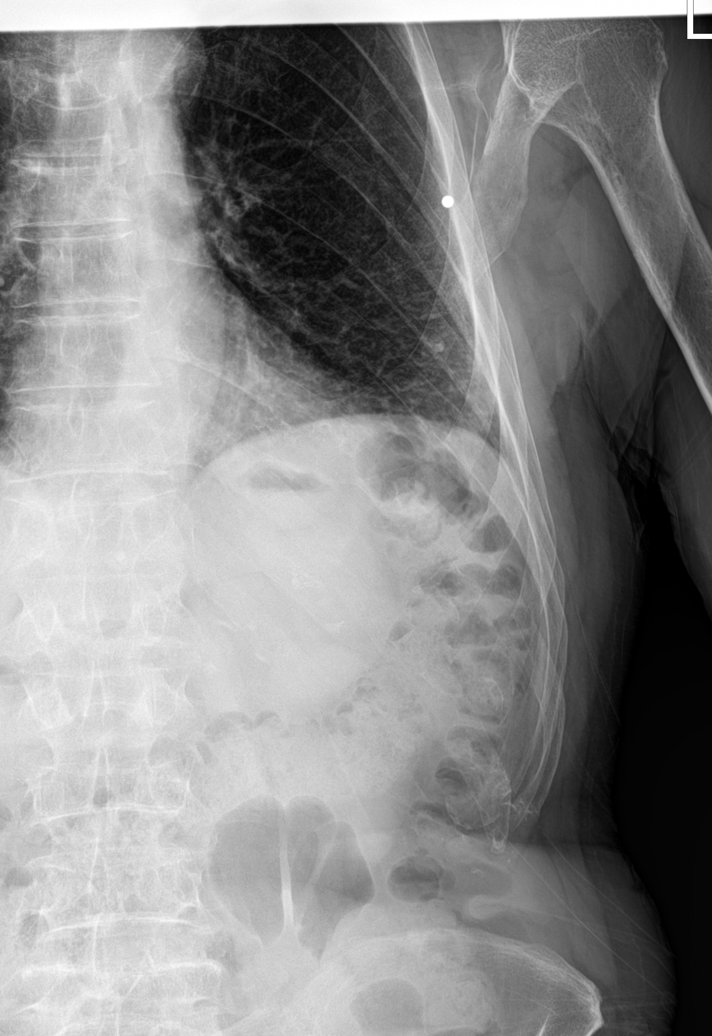

[5 of 5 positions shown; findings below may reference images not displayed]

FINDINGS: No fracture or other bone lesions are seen involving the ribs. There
is no evidence of pneumothorax or pleural effusion. New, asymmetric
hazy opacity within the left lower lung is identified. Heart size
and mediastinal contours are within normal limits.
IMPRESSION: 1. No rib fractures identified.
2. New hazy opacity within the left lower lung correlate for any
clinical signs or symptoms of pneumonia within the left lower lobe.
3. No pneumothorax or pleural effusion.

## 2022-10-12 ENCOUNTER — Encounter: Payer: Self-pay | Admitting: Family Medicine

## 2022-10-12 ENCOUNTER — Ambulatory Visit (INDEPENDENT_AMBULATORY_CARE_PROVIDER_SITE_OTHER): Payer: Medicare HMO | Admitting: Family Medicine

## 2022-10-12 VITALS — BP 113/70 | HR 76 | Temp 98.2°F | Ht 67.0 in | Wt 149.0 lb

## 2022-10-12 DIAGNOSIS — R42 Dizziness and giddiness: Secondary | ICD-10-CM

## 2022-10-12 DIAGNOSIS — G4730 Sleep apnea, unspecified: Secondary | ICD-10-CM | POA: Diagnosis not present

## 2022-10-12 DIAGNOSIS — R5383 Other fatigue: Secondary | ICD-10-CM

## 2022-10-12 NOTE — Progress Notes (Signed)
Acute Office Visit  Subjective:     Patient ID: Wesley Nguyen, male    DOB: 1932-01-24, 89 y.o.   MRN: [id number]  Chief Complaint  Patient presents with   Dizziness    Dizziness This is a recurrent problem. Episode onset: 6 months ago. The problem occurs intermittently. The problem has been gradually worsening (over last few weeks). Associated symptoms include fatigue, vertigo (spinning senstation) and weakness (in lower lower legs). Pertinent negatives include no abdominal pain, anorexia, chest pain, chills, coughing, fever, headaches, nausea, numbness, visual change or vomiting. The symptoms are aggravated by exertion and walking.   He has been sleeping well. He has been eating well. Reports hx of sleep apena but does have treatment for this. Daughter who lives with him reports apnea when he is sleeping. Denies shortness of breath, diaphoresis, night sweats, weight loss, palpitations. He only drinks 16-20 ounces of water a day.   Review of Systems  Constitutional:  Positive for fatigue. Negative for chills and fever.  Respiratory:  Negative for cough.   Cardiovascular:  Negative for chest pain.  Gastrointestinal:  Negative for abdominal pain, anorexia, nausea and vomiting.  Neurological:  Positive for dizziness, vertigo (spinning senstation) and weakness (in lower lower legs). Negative for numbness and headaches.        Objective:    BP 113/70   Pulse 76   Temp 98.2 F (36.8 C) (Temporal)   Ht  (1.702 m)   Wt 149 lb (67.6 kg)   SpO2 98%   BMI 23.34 kg/m    Physical Exam Vitals and nursing note reviewed.  Constitutional:      General: He is not in acute distress.    Appearance: He is not toxic-appearing or diaphoretic.  HENT:     Head: Normocephalic and atraumatic.     Right Ear: Tympanic membrane and external ear normal.     Left Ear: Tympanic membrane, ear canal and external ear normal.     Nose: Nose normal.     Mouth/Throat:     Mouth: Mucous membranes  are moist.     Pharynx: Oropharynx is clear.  Eyes:     Extraocular Movements: Extraocular movements intact.     Conjunctiva/sclera: Conjunctivae normal.     Pupils: Pupils are equal, round, and reactive to light.  Neck:     Vascular: No carotid bruit or JVD.  Cardiovascular:     Rate and Rhythm: Normal rate and regular rhythm.     Heart sounds: Normal heart sounds. No murmur heard.    No gallop. No S3 or S4 sounds.  Pulmonary:     Effort: Pulmonary effort is normal. No respiratory distress.     Breath sounds: Normal breath sounds. No wheezing.  Abdominal:     General: Bowel sounds are normal. There is no distension.     Palpations: Abdomen is soft.     Tenderness: There is no abdominal tenderness. There is no guarding.  Musculoskeletal:     Cervical back: Neck supple.     Right lower leg: No edema.     Left lower leg: No edema.  Lymphadenopathy:     Cervical: No cervical adenopathy.  Skin:    General: Skin is warm and dry.  Neurological:     Mental Status: He is alert and oriented to person, place, and time.     Cranial Nerves: No facial asymmetry.     Motor: Weakness (generalized, symmetric) present.     Coordination: Coordination normal.  Psychiatric:        Mood and Affect: Mood normal.        Speech: Speech normal.        Behavior: Behavior normal.     No results found for any visits on 10/12/22.      Assessment & Plan:   [name] was seen today for dizziness.  Diagnoses and all orders for this visit:  Dizziness Other fatigue Unsure of etiology. Intermittent x 6 months, gradually worsening. Generalized weakness, otherwise benign exam today with negative orthostatics. Will check labs as below. Discussed increasing water intake. Discussed zio for possible arrhthymias, patient declined.  Discussed referral for treatment of OSA for fatigue, patient declined.  -     Anemia Profile B -     BMP8+EGFR -     TSH  Sleep apnea, unspecified type  Return if symptoms  worsen or fail to improve.  The patient indicates understanding of these issues and agrees with the plan.   Gabriel Earing, FNP

## 2022-10-13 LAB — ANEMIA PROFILE B
Basophils Absolute: 0 10*3/uL (ref 0.0–0.2)
Basos: 0 %
EOS (ABSOLUTE): 0.1 10*3/uL (ref 0.0–0.4)
Eos: 1 %
Ferritin: 43 ng/mL (ref 30–400)
Folate: 16.3 ng/mL (ref >3.0)
Hematocrit: 41.2 % (ref 37.5–51.0)
Hemoglobin: 13.4 g/dL (ref 13.0–17.7)
Immature Grans (Abs): 0 10*3/uL (ref 0.0–0.1)
Immature Granulocytes: 0 %
Iron Saturation: 20 % (ref 15–55)
Iron: 63 ug/dL (ref 38–169)
Lymphocytes Absolute: 1.8 10*3/uL (ref 0.7–3.1)
Lymphs: 24 %
MCH: 29.6 pg (ref 26.6–33.0)
MCHC: 32.5 g/dL (ref 31.5–35.7)
MCV: 91 fL (ref 79–97)
Monocytes Absolute: 0.6 10*3/uL (ref 0.1–0.9)
Monocytes: 8 %
Neutrophils Absolute: 5 10*3/uL (ref 1.4–7.0)
Neutrophils: 67 %
Platelets: 241 10*3/uL (ref 150–450)
RBC: 4.52 x10E6/uL (ref 4.14–5.80)
RDW: 13.8 % (ref 11.6–15.4)
Retic Ct Pct: 0.9 % (ref 0.6–2.6)
Total Iron Binding Capacity: 319 ug/dL (ref 250–450)
UIBC: 256 ug/dL (ref 111–343)
Vitamin B-12: 568 pg/mL (ref 232–1245)
WBC: 7.5 10*3/uL (ref 3.4–10.8)

## 2022-10-13 LAB — TSH: TSH: 2.21 u[IU]/mL (ref 0.450–4.500)

## 2022-10-13 LAB — BMP8+EGFR
BUN/Creatinine Ratio: 17 (ref 10–24)
BUN: 19 mg/dL (ref 8–27)
CO2: 21 mmol/L (ref 20–29)
Calcium: 9.2 mg/dL (ref 8.6–10.2)
Chloride: 104 mmol/L (ref 96–106)
Creatinine, Ser: 1.15 mg/dL (ref 0.76–1.27)
Glucose: 99 mg/dL (ref 70–99)
Potassium: 4.3 mmol/L (ref 3.5–5.2)
Sodium: 140 mmol/L (ref 134–144)
eGFR: 61 mL/min/{1.73_m2} (ref >59)

## 2022-10-24 IMAGING — DX DG CHEST 2V
2 series · 2 of 2 positions shown · non-contrast
Comparison: 04/29/2021

CLINICAL DATA: Community the acquired pneumonia

EXAM:
CHEST - 2 VIEW

[chest pa]
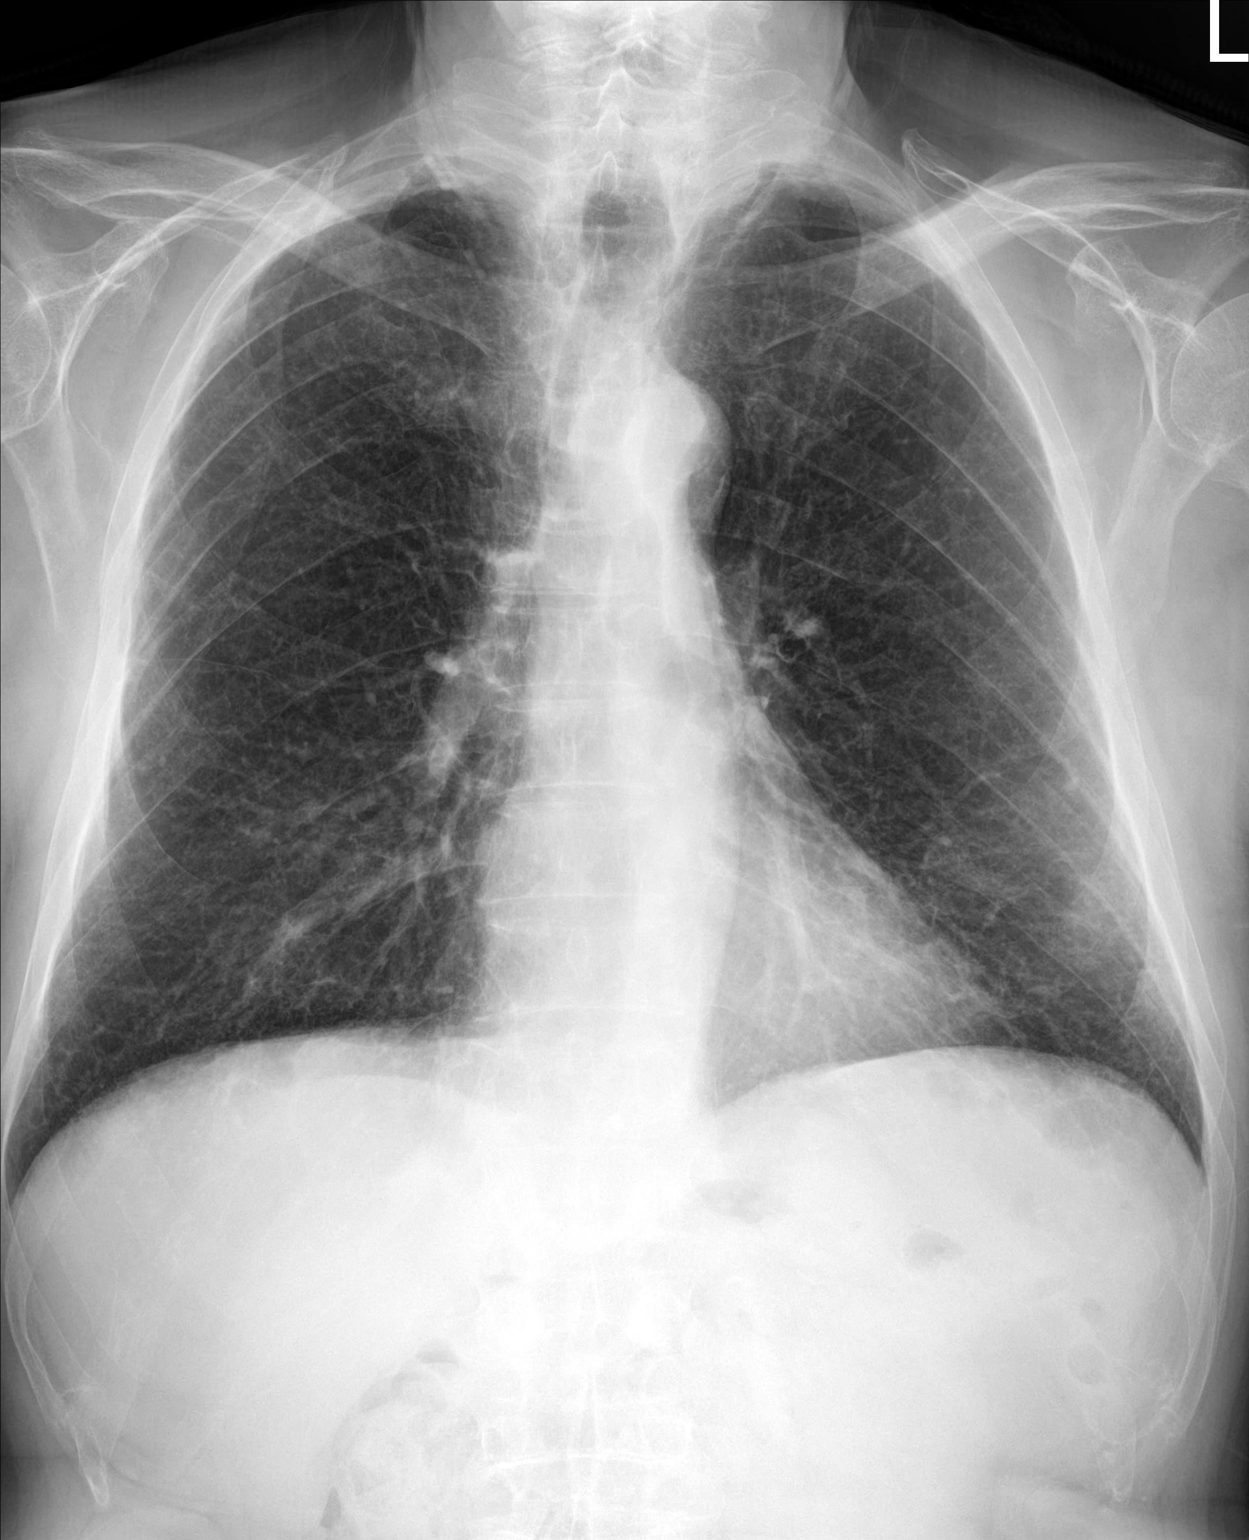

[chest lat]
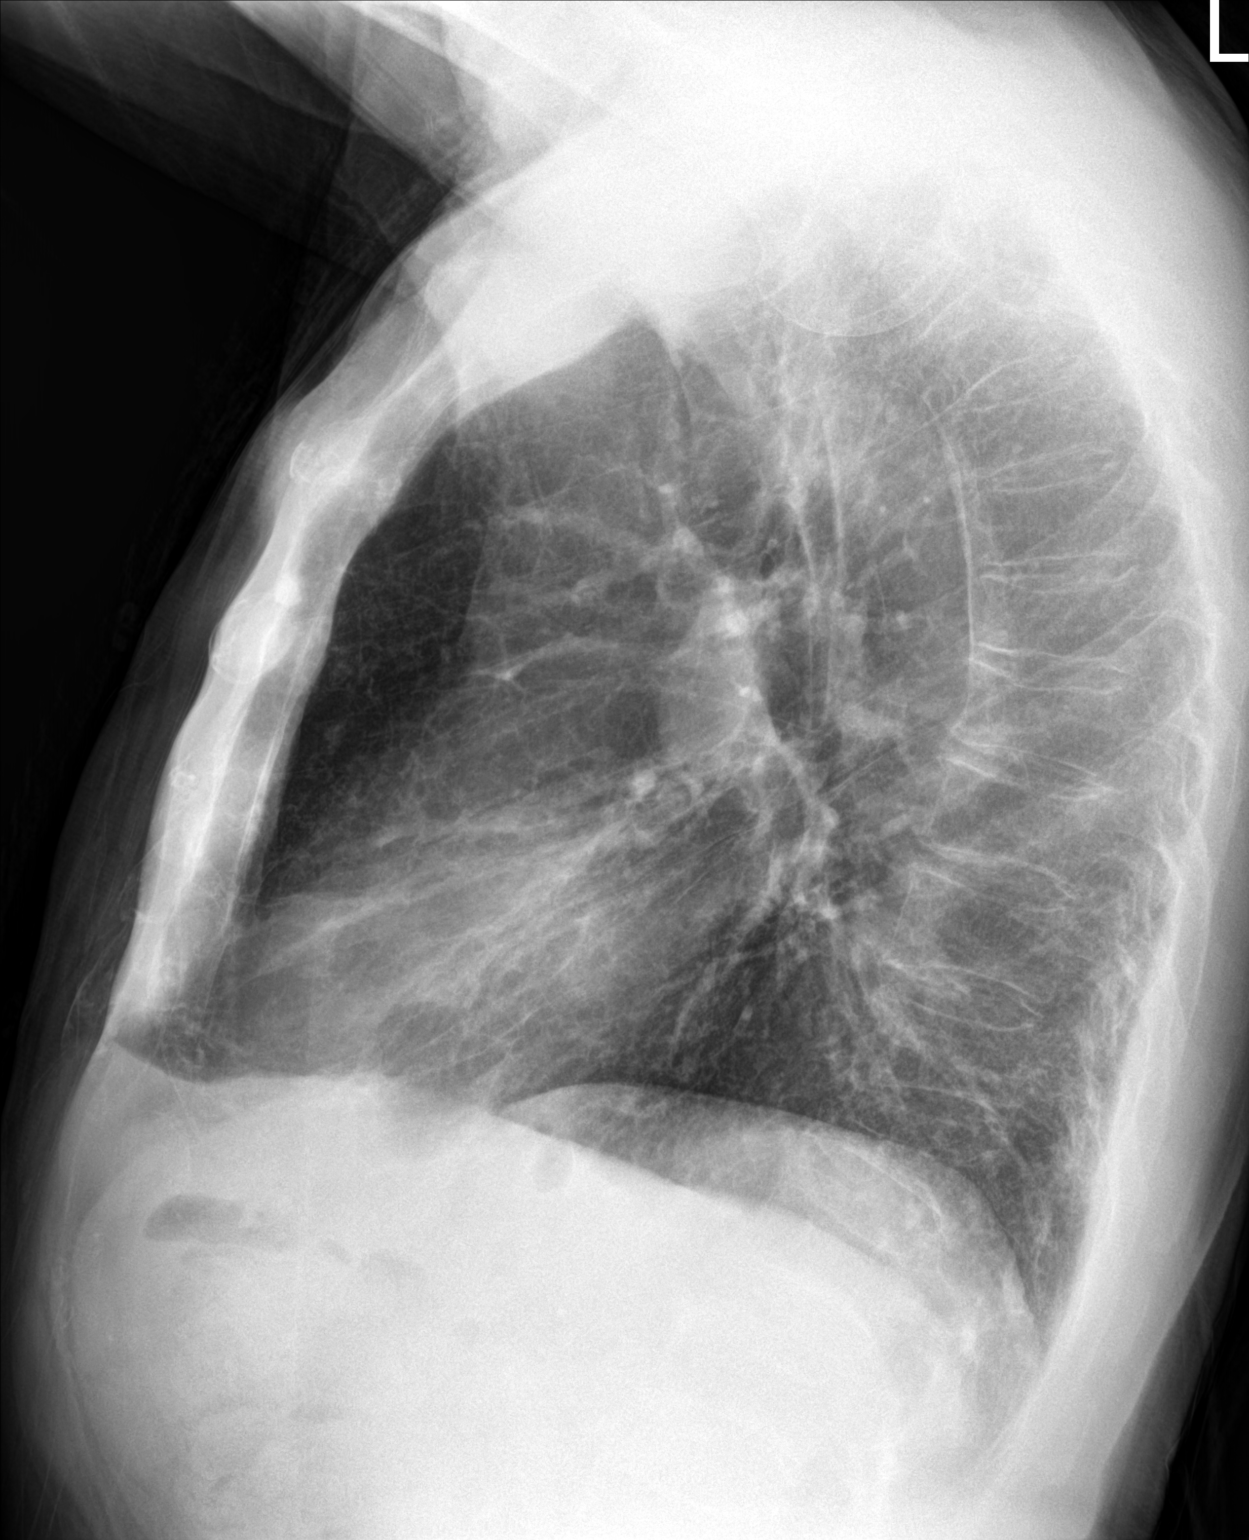

[2 of 2 positions shown; findings below may reference images not displayed]

FINDINGS: Normal heart size, mediastinal contours, and pulmonary vascularity.

Chronic accentuation of basilar interstitial markings unchanged.

Underlying emphysematous and bronchitic changes consistent with
COPD.

Biapical scarring.

Atherosclerotic calcification aorta.

No pulmonary infiltrate, pleural effusion, or pneumothorax.

Bones demineralized with compression deformity of a lower thoracic
vertebra.
IMPRESSION: COPD changes with bibasilar chronic interstitial lung disease and
biapical scarring.

No acute abnormalities.

Aortic Atherosclerosis (DM7ML-FI5.5) and Emphysema (DM7ML-D54.6).

## 2022-12-28 ENCOUNTER — Ambulatory Visit (INDEPENDENT_AMBULATORY_CARE_PROVIDER_SITE_OTHER): Payer: Medicare HMO | Admitting: Nurse Practitioner

## 2022-12-28 ENCOUNTER — Encounter: Payer: Self-pay | Admitting: Nurse Practitioner

## 2022-12-28 VITALS — BP 107/67 | HR 75 | Temp 97.7°F | Ht 67.0 in | Wt 147.6 lb

## 2022-12-28 DIAGNOSIS — J3489 Other specified disorders of nose and nasal sinuses: Secondary | ICD-10-CM | POA: Diagnosis not present

## 2022-12-28 DIAGNOSIS — R0981 Nasal congestion: Secondary | ICD-10-CM | POA: Diagnosis not present

## 2022-12-28 MED ORDER — FLUTICASONE PROPIONATE 50 MCG/ACT NA SUSP: 2.0000 | Freq: Every day | NASAL | 6 refills | Status: AC

## 2022-12-28 NOTE — Progress Notes (Signed)
   Acute Office Visit  Subjective:     Patient ID: Wesley Nguyen, male    DOB: 06-16-1932, 89 y.o.   MRN: [id number]  Chief Complaint  Patient presents with   Nasal Congestion    Has been going on for about a week. Has taken zyrtec and benadryl.    Cough    HPI Wesley Nguyen is a 89 y.o. male who complains of congestion, post nasal drip, and dry cough for 2 days. He denies a history of anorexia, chest pain, chills, fatigue, fevers, myalgias, nausea, weakness, and sputum production. He denies a history of asthma. Patient denies and does not smoke cigarettes.   ROS Negative unless indicated in HPI    Objective:    BP 107/67   Pulse 75   Temp 97.7 F (36.5 C) (Temporal)   Ht 5\' 7"  (1.702 m)   Wt 147 lb 9.6 oz (67 kg)   SpO2 95%   BMI 23.12 kg/m  BP Readings from Last 3 Encounters:  12/28/22 107/67  10/12/22 113/70  08/12/22 124/76   Wt Readings from Last 3 Encounters:  12/28/22 147 lb 9.6 oz (67 kg)  10/12/22 149 lb (67.6 kg)  08/12/22 151 lb 9.6 oz (68.8 kg)      Physical Exam Vitals as noted above. Appearance: alert, well appearing, and in no distress.  ENT- ENT exam normal, no neck nodes or sinus tenderness, bilateral TM normal without fluid or infection, post nasal drip noted, and nasal mucosa congested.  Chest - clear to auscultation, no wheezes, rales or rhonchi, symmetric air entry.  No results found for any visits on 12/28/22.      Assessment & Plan:  Nasal congestion with rhinorrhea -     Fluticasone Propionate; Place 2 sprays into both nostrils daily.  Dispense: 16 g; Refill: 6   ASSESSMENT: Rhinorrhea with congestion  PLAN: Flonase 1 spray each nostril at bedtime Symptomatic therapy suggested: push fluids, rest, and return office visit prn if symptoms persist or worsen.  Call or return to clinic prn if these symptoms worsen or fail to improve as anticipated.  Plan of care discussed with client ad spouse, all questions answered Return if  symptoms worsen or fail to improve.  Arrie Aran Santa Lighter, DNP [facility] [address] [place], [place] [address] [phone]

## 2022-12-29 ENCOUNTER — Telehealth: Payer: Self-pay | Admitting: Family Medicine

## 2022-12-29 ENCOUNTER — Other Ambulatory Visit: Payer: Self-pay | Admitting: Nurse Practitioner

## 2022-12-29 DIAGNOSIS — R0981 Nasal congestion: Secondary | ICD-10-CM

## 2022-12-29 DIAGNOSIS — J019 Acute sinusitis, unspecified: Secondary | ICD-10-CM

## 2022-12-29 MED ORDER — AZITHROMYCIN 250 MG PO TABS: ORAL_TABLET | ORAL | 0 refills | Status: DC

## 2022-12-29 NOTE — Telephone Encounter (Signed)
Spoke with Sheran Spine (dpr) aware and verbalized understanding.

## 2022-12-29 NOTE — Telephone Encounter (Signed)
Patient was seen on 7/8 and was not given any medication, daughter is worried about him getting pneumonia. Wants to know if something can be called in for him. Please call back and advise.

## 2023-01-01 ENCOUNTER — Ambulatory Visit (INDEPENDENT_AMBULATORY_CARE_PROVIDER_SITE_OTHER): Payer: Medicare HMO

## 2023-01-01 ENCOUNTER — Ambulatory Visit (INDEPENDENT_AMBULATORY_CARE_PROVIDER_SITE_OTHER): Payer: Medicare HMO | Admitting: Family Medicine

## 2023-01-01 ENCOUNTER — Encounter: Payer: Self-pay | Admitting: Family Medicine

## 2023-01-01 ENCOUNTER — Telehealth: Payer: Self-pay | Admitting: Family Medicine

## 2023-01-01 VITALS — BP 97/62 | HR 71 | Temp 98.7°F | Ht 67.0 in | Wt 146.0 lb

## 2023-01-01 DIAGNOSIS — R058 Other specified cough: Secondary | ICD-10-CM | POA: Diagnosis not present

## 2023-01-01 DIAGNOSIS — R9389 Abnormal findings on diagnostic imaging of other specified body structures: Secondary | ICD-10-CM

## 2023-01-01 MED ORDER — AMOXICILLIN-POT CLAVULANATE 875-125 MG PO TABS: 1.0000 | ORAL_TABLET | Freq: Two times a day (BID) | ORAL | 0 refills | Status: AC

## 2023-01-01 NOTE — Progress Notes (Signed)
Acute Office Visit  Subjective:  Patient ID: Wesley Nguyen, male    DOB: 1932/01/12, 89 y.o.   MRN: [id number]  Chief Complaint  Patient presents with   chest congestion    Z-pak did not help   HPI Patient is in today for continued cough. Started 5-6 days ago. He was treated with a zpack recently. Reports that he feels weaker today than Monday. Cough is worse at night. Endorses runny nose x 2 weeks. Tmax 100.5. States that he has a history of bronchitis and pneumonia. Productive cough with yellow sputum. Reports dizziness with walking.  Also using zyrtec. States that he does not like flonase, because "there are so many directions"   ROS As per HPI  Objective:  BP 97/62   Pulse 71   Temp 98.7 F (37.1 C)   Ht 5\' 7"  (1.702 m)   Wt 146 lb (66.2 kg)   SpO2 95%   BMI 22.87 kg/m   Physical Exam Constitutional:      General: He is awake. He is not in acute distress.    Appearance: Normal appearance. He is well-developed and well-groomed. He is ill-appearing. He is not toxic-appearing or diaphoretic.     Interventions: He is not intubated. HENT:     Head:     Salivary Glands: Right salivary gland is not diffusely enlarged or tender. Left salivary gland is not diffusely enlarged or tender.     Right Ear: Decreased hearing noted. No laceration, drainage, swelling or tenderness. No middle ear effusion. There is no impacted cerumen. No foreign body. No mastoid tenderness. No PE tube. No hemotympanum. Tympanic membrane is injected. Tympanic membrane is not scarred, perforated, erythematous, retracted or bulging.     Left Ear: Decreased hearing noted. No laceration, drainage, swelling or tenderness.  No middle ear effusion. There is no impacted cerumen. No foreign body. No mastoid tenderness. No PE tube. No hemotympanum. Tympanic membrane is injected. Tympanic membrane is not scarred, perforated, erythematous, retracted or bulging.     Ears:     Comments: Wears hearing aides bilaterally   Dry flaky skin bilateral ear canals     Nose: No congestion or rhinorrhea.     Right Sinus: No maxillary sinus tenderness or frontal sinus tenderness.     Left Sinus: No maxillary sinus tenderness or frontal sinus tenderness.     Mouth/Throat:     Mouth: Mucous membranes are dry. No oral lesions.     Tongue: No lesions.     Palate: No mass.     Pharynx: Oropharynx is clear.     Tonsils: No tonsillar exudate or tonsillar abscesses.     Comments: Purple-tinged lips  Cardiovascular:     Rate and Rhythm: Normal rate and regular rhythm.     Pulses: Normal pulses.          Radial pulses are 2+ on the right side and 2+ on the left side.       Posterior tibial pulses are 2+ on the right side and 2+ on the left side.     Heart sounds: Normal heart sounds. No murmur heard.    No gallop.  Pulmonary:     Effort: Pulmonary effort is normal. No tachypnea, bradypnea, accessory muscle usage, prolonged expiration, respiratory distress or retractions. He is not intubated.     Breath sounds: Decreased air movement present. No stridor. Decreased breath sounds present. No wheezing, rhonchi or rales.  Chest:     Chest wall: No mass.  Musculoskeletal:  Cervical back: Full passive range of motion without pain and neck supple.     Right lower leg: No edema.     Left lower leg: No edema.  Lymphadenopathy:     Head:     Right side of head: No submental, submandibular, tonsillar, preauricular or posterior auricular adenopathy.     Left side of head: Tonsillar adenopathy present. No submental, submandibular, preauricular or posterior auricular adenopathy.  Skin:    General: Skin is warm.     Capillary Refill: Capillary refill takes less than 2 seconds.     Coloration: Skin is pale.  Neurological:     General: No focal deficit present.     Mental Status: He is alert, oriented to person, place, and time and easily aroused. Mental status is at baseline.     GCS: GCS eye subscore is 4. GCS verbal subscore  is 5. GCS motor subscore is 6.     Motor: No weakness.  Psychiatric:        Attention and Perception: Attention and perception normal.        Mood and Affect: Mood and affect normal.        Speech: Speech normal.        Behavior: Behavior normal. Behavior is cooperative.        Thought Content: Thought content normal. Thought content does not include homicidal or suicidal ideation. Thought content does not include homicidal or suicidal plan.        Cognition and Memory: Cognition and memory normal.        Judgment: Judgment normal.       01/01/2023    1:57 PM 12/28/2022   11:50 AM 10/12/2022   11:12 AM  Depression screen PHQ 2/9  Decreased Interest 0 0 0  Down, Depressed, Hopeless 0 0 0  PHQ - 2 Score 0 0 0  Altered sleeping 0 0 0  Tired, decreased energy 0 0 0  Change in appetite 0 0 0  Feeling bad or failure about yourself  0 0 0  Trouble concentrating 0 0 0  Moving slowly or fidgety/restless 0 0 0  Suicidal thoughts 0 0 0  PHQ-9 Score 0 0 0  Difficult doing work/chores Not difficult at all Not difficult at all Not difficult at all      01/01/2023    1:57 PM 12/28/2022   11:50 AM 10/12/2022   11:13 AM 04/22/2022   10:37 AM  GAD 7 : Generalized Anxiety Score  Nervous, Anxious, on Edge 0 0 0 0  Control/stop worrying 0 0 0 0  Worry too much - different things 0 0 0 0  Trouble relaxing 0 0 0 0  Restless 0 0 0 0  Easily annoyed or irritable 0 0 0 0  Afraid - awful might happen 0 0 0 0  Total GAD 7 Score 0 0 0 0  Anxiety Difficulty Not difficult at all Not difficult at all Not difficult at all Not difficult at all   Assessment & Plan:  1. Productive cough Will obtain imaging as below and start antibiotic as below. Will treat empirically for COPD exacerbation as there was suspicion for COPD on previous CXR.  - DG Chest 2 View; Future - amoxicillin-clavulanate (AUGMENTIN) 875-125 MG tablet; Take 1 tablet by mouth 2 (two) times daily for 7 days.  Dispense: 14 tablet; Refill:  0  2. Abnormal chest x-ray Will repeat imaging as above to monitor for COPD. There was possible COPD diagnosis on CXR  from 06/10/2021  DG Chest 2 View 06/10/2021 IMPRESSION: COPD changes with bibasilar chronic interstitial lung disease and biapical scarring.   No acute abnormalities.   Aortic Atherosclerosis (ICD10-I70.0) and Emphysema (ICD10-J43.9).   Electronically Signed   By: Ulyses Southward M.D.   On: 06/11/2021 11:00  The above assessment and management plan was discussed with the patient. The patient verbalized understanding of and has agreed to the management plan using shared-decision making. Patient is aware to call the clinic if they develop any new symptoms or if symptoms fail to improve or worsen. Patient is aware when to return to the clinic for a follow-up visit. Patient educated on when it is appropriate to go to the emergency department.   Return if symptoms worsen or fail to improve.  Neale Burly, DNP-FNP [facility] [address] [place], [place] [address] [phone]

## 2023-01-01 NOTE — Patient Instructions (Signed)
[facility] Nighttime   - Get plenty of rest and drink plenty of fluids. - Try to breathe moist air. Use a cold mist humidifier. - Consume warm fluids (soup or tea) to provide relief for a stuffy nose and to loosen phlegm. - For nasal stuffiness, try saline nasal spray or a Neti Pot. Afrin nasal spray can also be used but this product should not be used longer than 3 days or it will cause rebound nasal stuffiness (worsening nasal congestion). - For sore throat pain relief: use chloraseptic spray, suck on throat lozenges, hard candy or popsicles; gargle with warm salt water (1/4 tsp. salt per 8 oz. of water); and eat soft, bland foods. - Eat a well-balanced diet. If you cannot, ensure you are getting enough nutrients by taking a daily multivitamin. - Avoid dairy products, as they can thicken phlegm. - Avoid alcohol, as it impairs your body's immune system.  CONTACT YOUR DOCTOR IF YOU EXPERIENCE ANY OF THE FOLLOWING: - High fever - Ear pain - Sinus-type headache - Unusually severe cold symptoms - Cough that gets worse while other cold symptoms improve - Flare up of any chronic lung problem, such as asthma - Your symptoms persist longer than 2 weeks

## 2023-01-08 NOTE — Progress Notes (Signed)
Best on Chest Xray, recommend patient follow up with CXR in 3-4 weeks after he finishes antibiotics. Order placed.

## 2023-02-10 ENCOUNTER — Ambulatory Visit (INDEPENDENT_AMBULATORY_CARE_PROVIDER_SITE_OTHER): Payer: Medicare HMO | Admitting: Family Medicine

## 2023-02-10 ENCOUNTER — Ambulatory Visit (INDEPENDENT_AMBULATORY_CARE_PROVIDER_SITE_OTHER): Payer: Medicare HMO

## 2023-02-10 ENCOUNTER — Encounter: Payer: Self-pay | Admitting: Family Medicine

## 2023-02-10 VITALS — BP 91/62 | HR 74 | Temp 97.7°F | Ht 67.0 in | Wt 142.8 lb

## 2023-02-10 DIAGNOSIS — R42 Dizziness and giddiness: Secondary | ICD-10-CM

## 2023-02-10 DIAGNOSIS — R058 Other specified cough: Secondary | ICD-10-CM | POA: Diagnosis not present

## 2023-02-10 DIAGNOSIS — R9389 Abnormal findings on diagnostic imaging of other specified body structures: Secondary | ICD-10-CM | POA: Diagnosis not present

## 2023-02-10 DIAGNOSIS — J189 Pneumonia, unspecified organism: Secondary | ICD-10-CM

## 2023-02-10 MED ORDER — MECLIZINE HCL 25 MG PO TABS: 25.0000 mg | ORAL_TABLET | Freq: Three times a day (TID) | ORAL | 0 refills | Status: AC | PRN

## 2023-02-10 MED ORDER — PREDNISONE 10 MG PO TABS: ORAL_TABLET | ORAL | 0 refills | Status: DC

## 2023-02-10 NOTE — Progress Notes (Signed)
Subjective:  Patient ID: Wesley Nguyen, male    DOB: January 22, 1932  Age: 89 y.o. MRN: [id number]  CC: Fatigue, Night Sweats, and Dizziness   HPI Wesley Nguyen presents for feeling dizzy and weak. Recent pneumonia. Here for repeat CXR. Describes the dizziness as room spinning. Denies fever. Sx have overall improved since last evaluation. Dyspnea has resolved.     02/10/2023    2:28 PM 01/01/2023    1:57 PM 12/28/2022   11:50 AM  Depression screen PHQ 2/9  Decreased Interest 0 0 0  Down, Depressed, Hopeless 0 0 0  PHQ - 2 Score 0 0 0  Altered sleeping  0 0  Tired, decreased energy  0 0  Change in appetite  0 0  Feeling bad or failure about yourself   0 0  Trouble concentrating  0 0  Moving slowly or fidgety/restless  0 0  Suicidal thoughts  0 0  PHQ-9 Score  0 0  Difficult doing work/chores  Not difficult at all Not difficult at all    History Wesley Nguyen has a past medical history of BPH (benign prostatic hyperplasia).   He has a past surgical history that includes Joint replacement; Esophagogastroduodenoscopy (egd) with propofol (N/A, 08/31/2019); External ear surgery (Right, 05/2019); and Cataract extraction.   His family history includes Cancer in his mother; Dementia in his father; Heart disease in his father; Macular degeneration in his mother.He reports that he quit smoking about 59 years ago. His smoking use included cigarettes. He started smoking about 74 years ago. He has a 43.2 pack-year smoking history. He has never used smokeless tobacco. He reports that he does not drink alcohol and does not use drugs.    ROS Review of Systems  Constitutional:  Positive for fatigue. Negative for fever.  Respiratory:  Positive for cough. Negative for shortness of breath.   Cardiovascular:  Negative for chest pain.  Musculoskeletal:  Negative for arthralgias.  Skin:  Negative for rash.    Objective:  BP 91/62   Pulse 74   Temp 97.7 F (36.5 C)   Ht 5\' 7"  (1.702 m)   Wt 142 lb  12.8 oz (64.8 kg)   SpO2 97%   BMI 22.37 kg/m   BP Readings from Last 3 Encounters:  02/10/23 91/62  01/01/23 97/62  12/28/22 107/67    Wt Readings from Last 3 Encounters:  02/10/23 142 lb 12.8 oz (64.8 kg)  01/01/23 146 lb (66.2 kg)  12/28/22 147 lb 9.6 oz (67 kg)     Physical Exam Vitals reviewed.  Constitutional:      Appearance: He is well-developed. He is ill-appearing.  HENT:     Head: Normocephalic and atraumatic.     Right Ear: External ear normal.     Left Ear: External ear normal.     Mouth/Throat:     Pharynx: No oropharyngeal exudate or posterior oropharyngeal erythema.  Eyes:     Pupils: Pupils are equal, round, and reactive to light.  Cardiovascular:     Rate and Rhythm: Normal rate and regular rhythm.     Heart sounds: No murmur heard. Pulmonary:     Effort: No respiratory distress.     Breath sounds: Normal breath sounds.  Musculoskeletal:     Cervical back: Normal range of motion and neck supple.  Neurological:     Mental Status: He is alert and oriented to person, place, and time.       Assessment & Plan:   Wesley Nguyen was seen today  for fatigue, night sweats and dizziness.  Diagnoses and all orders for this visit:  Community acquired pneumonia of left lower lobe of lung -     CBC with Differential/Platelet -     CMP14+EGFR  Vertigo -     CBC with Differential/Platelet -     CMP14+EGFR  Other orders -     predniSONE (DELTASONE) 10 MG tablet; Take 5 daily for 3 days followed by 4,3,2 and 1 for 3 days each. -     meclizine (ANTIVERT) 25 MG tablet; Take 1 tablet (25 mg total) by mouth 3 (three) times daily as needed for dizziness.       I have discontinued Wesley Nguyen's azithromycin. I am also having him start on predniSONE and meclizine. Additionally, I am having him maintain his pantoprazole, tamsulosin, multivitamin, glucosamine-chondroitin, and fluticasone.  Allergies as of 02/10/2023       Reactions   Naproxen Sodium     Bactrim [sulfamethoxazole-trimethoprim] Rash        Medication List        Accurate as of February 10, 2023 11:59 PM. If you have any questions, ask your nurse or doctor.          STOP taking these medications    azithromycin 250 MG tablet Commonly known as: Zithromax Z-Pak Stopped by: Wesley Nguyen       TAKE these medications    fluticasone 50 MCG/ACT nasal spray Commonly known as: FLONASE Place 2 sprays into both nostrils daily.   glucosamine-chondroitin 500-400 MG tablet Take 1 tablet by mouth 3 (three) times daily.   meclizine 25 MG tablet Commonly known as: ANTIVERT Take 1 tablet (25 mg total) by mouth 3 (three) times daily as needed for dizziness. Started by: Wesley Nguyen   multivitamin tablet Take 1 tablet by mouth daily.   pantoprazole 40 MG tablet Commonly known as: Protonix Take 1 tablet (40 mg total) by mouth daily.   predniSONE 10 MG tablet Commonly known as: DELTASONE Take 5 daily for 3 days followed by 4,3,2 and 1 for 3 days each. Started by: Wesley Nguyen   tamsulosin 0.4 MG Caps capsule Commonly known as: FLOMAX 1 capsule Orally Once a day         Follow-up: Return if symptoms worsen or fail to improve.  Wesley Nguyen, M.D.

## 2023-02-11 LAB — CBC WITH DIFFERENTIAL/PLATELET
Basophils Absolute: 0 10*3/uL (ref 0.0–0.2)
Basos: 0 %
EOS (ABSOLUTE): 0.3 10*3/uL (ref 0.0–0.4)
Eos: 3 %
Hematocrit: 40.1 % (ref 37.5–51.0)
Hemoglobin: 13.4 g/dL (ref 13.0–17.7)
Immature Grans (Abs): 0 10*3/uL (ref 0.0–0.1)
Immature Granulocytes: 0 %
Lymphocytes Absolute: 2.1 10*3/uL (ref 0.7–3.1)
Lymphs: 23 %
MCH: 29.8 pg (ref 26.6–33.0)
MCHC: 33.4 g/dL (ref 31.5–35.7)
MCV: 89 fL (ref 79–97)
Monocytes Absolute: 0.8 10*3/uL (ref 0.1–0.9)
Monocytes: 8 %
Neutrophils Absolute: 6.1 10*3/uL (ref 1.4–7.0)
Neutrophils: 66 %
Platelets: 316 10*3/uL (ref 150–450)
RBC: 4.49 x10E6/uL (ref 4.14–5.80)
RDW: 13.5 % (ref 11.6–15.4)
WBC: 9.3 10*3/uL (ref 3.4–10.8)

## 2023-02-11 LAB — CMP14+EGFR
ALT: 19 IU/L (ref 0–44)
AST: 22 IU/L (ref 0–40)
Albumin: 4.1 g/dL (ref 3.7–4.7)
Alkaline Phosphatase: 84 IU/L (ref 44–121)
BUN/Creatinine Ratio: 17 (ref 10–24)
BUN: 21 mg/dL (ref 8–27)
Bilirubin Total: 0.3 mg/dL (ref 0.0–1.2)
CO2: 24 mmol/L (ref 20–29)
Calcium: 9.3 mg/dL (ref 8.6–10.2)
Chloride: 103 mmol/L (ref 96–106)
Creatinine, Ser: 1.24 mg/dL (ref 0.76–1.27)
Globulin, Total: 2.1 g/dL (ref 1.5–4.5)
Glucose: 70 mg/dL (ref 70–99)
Potassium: 4.3 mmol/L (ref 3.5–5.2)
Sodium: 142 mmol/L (ref 134–144)
Total Protein: 6.2 g/dL (ref 6.0–8.5)
eGFR: 56 mL/min/{1.73_m2} — ABNORMAL LOW (ref >59)

## 2023-02-11 NOTE — Progress Notes (Signed)
Hello Richard,  Your lab result is normal and/or stable.Some minor variations that are not significant are commonly marked abnormal, but do not represent any medical problem for you.  Best regards, Warren Stacks, M.D.

## 2023-02-17 NOTE — Progress Notes (Signed)
CXR improved. Consistent with pneumonia as it has improved after antibiotics. Recommend patient follow up if he is still having symptoms or concerns.

## 2023-06-25 ENCOUNTER — Ambulatory Visit (INDEPENDENT_AMBULATORY_CARE_PROVIDER_SITE_OTHER): Payer: Medicare HMO | Admitting: Family Medicine

## 2023-06-25 ENCOUNTER — Encounter: Payer: Self-pay | Admitting: Family Medicine

## 2023-06-25 VITALS — BP 123/74 | Temp 97.8°F | Ht 67.0 in | Wt 148.6 lb

## 2023-06-25 DIAGNOSIS — J310 Chronic rhinitis: Secondary | ICD-10-CM | POA: Diagnosis not present

## 2023-06-25 DIAGNOSIS — G4730 Sleep apnea, unspecified: Secondary | ICD-10-CM

## 2023-06-25 MED ORDER — LEVOCETIRIZINE DIHYDROCHLORIDE 5 MG PO TABS: 2.5000 mg | ORAL_TABLET | Freq: Every evening | ORAL | 0 refills | Status: DC

## 2023-06-25 NOTE — Progress Notes (Signed)
 Acute Office Visit  Subjective:     Patient ID: Wesley Nguyen, male    DOB: 23-Apr-1932, 89 y.o.   MRN: [id number]  Chief Complaint  Patient presents with   Cough    Cough This is a chronic problem. The current episode started more than 1 year ago. The problem has been waxing and waning. The cough is Non-productive. Associated symptoms include nasal congestion, postnasal drip, rhinorrhea and a sore throat. Pertinent negatives include no chest pain, chills, ear congestion, ear pain, fever, headaches, hemoptysis, shortness of breath or wheezing. Treatments tried: xyzal , zyrtec. The treatment provided mild relief. His past medical history is significant for environmental allergies and pneumonia. There is no history of asthma or COPD.  He has flonase  at home but has not used it.   Reports hx of sleep apnea years ago. He would not wear CPAP. Snores at night and stops breathing in the sleep sometimes. Falls asleep quickly during the day if he sits down.   Review of Systems  Constitutional:  Negative for chills and fever.  HENT:  Positive for postnasal drip, rhinorrhea and sore throat. Negative for ear pain.   Respiratory:  Positive for cough. Negative for hemoptysis, shortness of breath and wheezing.   Cardiovascular:  Negative for chest pain.  Neurological:  Negative for headaches.  Endo/Heme/Allergies:  Positive for environmental allergies.        Objective:    BP 123/74   Temp 97.8 F (36.6 C) (Temporal)   Ht 5' 7 (1.702 m)   Wt 148 lb 9.6 oz (67.4 kg)   SpO2 96%   BMI 23.27 kg/m    Physical Exam Vitals and nursing note reviewed.  Constitutional:      General: He is not in acute distress.    Appearance: He is not ill-appearing, toxic-appearing or diaphoretic.  HENT:     Right Ear: Tympanic membrane, ear canal and external ear normal.     Left Ear: Tympanic membrane, ear canal and external ear normal.     Nose: Rhinorrhea present.     Mouth/Throat:     Mouth: Mucous  membranes are moist.     Pharynx: Oropharynx is clear. Postnasal drip present. No pharyngeal swelling, oropharyngeal exudate, posterior oropharyngeal erythema or uvula swelling.     Tonsils: No tonsillar exudate or tonsillar abscesses. 0 on the right. 0 on the left.  Eyes:     General:        Right eye: No discharge.        Left eye: No discharge.  Cardiovascular:     Rate and Rhythm: Normal rate and regular rhythm.     Heart sounds: Normal heart sounds. No murmur heard. Pulmonary:     Effort: Pulmonary effort is normal. No respiratory distress.     Breath sounds: Normal breath sounds. No wheezing, rhonchi or rales.  Chest:     Chest wall: No tenderness.  Musculoskeletal:     Cervical back: Neck supple. No rigidity.     Right lower leg: No edema.     Left lower leg: No edema.  Lymphadenopathy:     Cervical: No cervical adenopathy.  Skin:    General: Skin is warm and dry.  Neurological:     Mental Status: He is alert and oriented to person, place, and time. Mental status is at baseline.  Psychiatric:        Mood and Affect: Mood normal.        Behavior: Behavior normal.  No results found for any visits on 06/25/23.      Assessment & Plan:   Carols was seen today for cough.  Diagnoses and all orders for this visit:  Chronic rhinitis Uncontrolled. Restart xyzal . Add flonase  if no improvement.  -     levocetirizine (XYZAL ) 5 MG tablet; Take 0.5 tablets (2.5 mg total) by mouth every evening. For allergies, runny nose.  Sleep apnea, unspecified type Declined referral today. Discussed symptoms related to untreated sleep apnea.   Return if symptoms worsen or fail to improve.  The patient indicates understanding of these issues and agrees with the plan.   Annabella CHRISTELLA Search, FNP

## 2023-11-19 ENCOUNTER — Ambulatory Visit (INDEPENDENT_AMBULATORY_CARE_PROVIDER_SITE_OTHER): Admitting: Family Medicine

## 2023-11-19 ENCOUNTER — Encounter: Payer: Self-pay | Admitting: Family Medicine

## 2023-11-19 VITALS — BP 110/67 | HR 71 | Temp 97.5°F | Ht 67.0 in | Wt 148.0 lb

## 2023-11-19 DIAGNOSIS — L282 Other prurigo: Secondary | ICD-10-CM | POA: Diagnosis not present

## 2023-11-19 DIAGNOSIS — L989 Disorder of the skin and subcutaneous tissue, unspecified: Secondary | ICD-10-CM

## 2023-11-19 DIAGNOSIS — R630 Anorexia: Secondary | ICD-10-CM

## 2023-11-19 MED ORDER — PREDNISONE 20 MG PO TABS: 40.0000 mg | ORAL_TABLET | Freq: Every day | ORAL | 0 refills | Status: AC

## 2023-11-19 NOTE — Progress Notes (Signed)
 Subjective:  Patient ID: Wesley Nguyen, male    DOB: [birth year], 89 y.o.   MRN: [id number]  Patient Care Team: Galvin Jules, FNP as PCP - General (Family Medicine)   Chief Complaint:  Rash (Patient states it is all over body but mostly on his face x 1 month.)   HPI: Wesley Nguyen is a 89 y.o. male presenting on 11/19/2023 for Rash (Patient states it is all over body but mostly on his face x 1 month.)  Wesley Nguyen is an 89 year old male who presents with itchy, dry, red skin and scaly patches on his arms and back.  He has been experiencing itchy, dry, and red skin for the past couple of weeks, which has progressively worsened. Various lotions, including Dermasil, have been tried without relief. The condition causes significant distress, as he feels embarrassed to attend social events like church due to the appearance of his skin.  He has a history of skin cancer, with a lesion on his head treated at the [facility] over a year ago. His arms are scaly and raw, and he has pruritic spots on his back. He has not seen a dermatologist recently, although he was referred to one years ago for a spot on his back, which was initially suspected to be cancerous but was not.  He has been using Zyrtec occasionally, provided by a family member, but is not currently taking it regularly. He also has ketoconazole shampoo from the [place], which he has used in the past without success, as it caused irritation on his scalp.  He reports a loss of appetite, stating that 'nothing turns him on to eat.' He has been consuming oatmeal and eggs but has lost interest in food, including protein drinks he used to take. A family member notes that he has been eating the same foods repeatedly, which may contribute to his decreased appetite.  His family history includes skin problems, as his mother also had similar issues. He has been trying various remedies, including goat milk soap, but has not found a solution that works.           Relevant past medical, surgical, family, and social history reviewed and updated as indicated.  Allergies and medications reviewed and updated. Data reviewed: Chart in Epic.   Past Medical History:  Diagnosis Date   BPH (benign prostatic hyperplasia)     Past Surgical History:  Procedure Laterality Date   CATARACT EXTRACTION     ESOPHAGOGASTRODUODENOSCOPY (EGD) WITH PROPOFOL  N/A 08/31/2019   Procedure: ESOPHAGOGASTRODUODENOSCOPY (EGD) WITH PROPOFOL ;  Surgeon: Suzette Espy, MD;  Location: [facility];  Service: Endoscopy;  Laterality: N/A;   EXTERNAL EAR SURGERY Right 05/2019   For skin cancer   JOINT REPLACEMENT     knee    Social History   Socioeconomic History   Marital status: Widowed    Spouse name: Not on file   Number of children: 6   Years of education: 87   Highest education level: 12th grade  Occupational History   Occupation: Retired    Comment: Veteran  Tobacco Use   Smoking status: Former    Current packs/day: 0.00    Average packs/day: 3.0 packs/day for 14.4 years (43.2 ttl pk-yrs)    Types: Cigarettes    Start date: 01/23/1949    Quit date: 06/23/1963    Years since quitting: 60.4   Smokeless tobacco: Never  Vaping Use   Vaping status: Never Used  Substance and Sexual Activity  Alcohol use: No    Alcohol/week: 0.0 standard drinks of alcohol   Drug use: No   Sexual activity: Not Currently  Other Topics Concern   Not on file  Social History Narrative   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   Social Drivers of Corporate investment banker Strain: Low Risk  (05/28/2022)   Overall Financial Resource Strain (CARDIA)    Difficulty of Paying Living Expenses: Not very hard  Food Insecurity: No Food Insecurity (05/28/2022)   Hunger Vital Sign    Worried About Running Out of Food in the Last Year: Never true    Ran Out of Food in the Last Year: Never true  Transportation Needs: No Transportation Needs (05/28/2022)   PRAPARE -  Administrator, Civil Service (Medical): No    Lack of Transportation (Non-Medical): No  Physical Activity: Sufficiently Active (05/28/2022)   Exercise Vital Sign    Days of Exercise per Week: 7 days    Minutes of Exercise per Session: 30 min  Stress: No Stress Concern Present (05/28/2022)   [employer] of Occupational Health - Occupational Stress Questionnaire    Feeling of Stress : Not at all  Social Connections: Moderately Integrated (05/28/2022)   Social Connection and Isolation Panel [NHANES]    Frequency of Communication with Friends and Family: Once a week    Frequency of Social Gatherings with Friends and Family: More than three times a week    Attends Religious Services: More than 4 times per year    Active Member of [employer] or Organizations: Yes    Attends Banker Meetings: More than 4 times per year    Marital Status: Widowed  Intimate Partner Violence: Not At Risk (05/28/2022)   Humiliation, Afraid, Rape, and Kick questionnaire    Fear of Current or Ex-Partner: No    Emotionally Abused: No    Physically Abused: No    Sexually Abused: No    Outpatient Encounter Medications as of 11/19/2023  Medication Sig   fluticasone  (FLONASE ) 50 MCG/ACT nasal spray Place 2 sprays into both nostrils daily.   glucosamine-chondroitin 500-400 MG tablet Take 1 tablet by mouth 3 (three) times daily.   meclizine  (ANTIVERT ) 25 MG tablet Take 1 tablet (25 mg total) by mouth 3 (three) times daily as needed for dizziness.   Multiple Vitamin (MULTIVITAMIN) tablet Take 1 tablet by mouth daily.   predniSONE  (DELTASONE ) 20 MG tablet Take 2 tablets (40 mg total) by mouth daily with breakfast for 5 days.   tamsulosin  (FLOMAX ) 0.4 MG CAPS capsule 1 capsule Orally Once a day   Homeopathic Products (ALLERGY MEDICINE PO) Take by mouth. (Patient not taking: Reported on 11/19/2023)   levocetirizine (XYZAL ) 5 MG tablet Take 0.5 tablets (2.5 mg total) by mouth every evening. For  allergies, runny nose. (Patient not taking: Reported on 11/19/2023)   pantoprazole  (PROTONIX ) 40 MG tablet Take 1 tablet (40 mg total) by mouth daily.   No facility-administered encounter medications on file as of 11/19/2023.    Allergies  Allergen Reactions   Naproxen Sodium    Bactrim [Sulfamethoxazole-Trimethoprim] Rash    Pertinent ROS per HPI, otherwise unremarkable      Objective:  BP 110/67   Pulse 71   Temp (!) 97.5 F (36.4 C)   Ht 5\' 7"  (1.702 m)   Wt 148 lb (67.1 kg)   SpO2 93%   BMI 23.18 kg/m    Wt Readings from Last  3 Encounters:  11/19/23 148 lb (67.1 kg)  06/25/23 148 lb 9.6 oz (67.4 kg)  02/10/23 142 lb 12.8 oz (64.8 kg)    Physical Exam Vitals and nursing note reviewed.  Constitutional:      General: He is not in acute distress.    Appearance: He is ill-appearing (chronically ill). He is not toxic-appearing or diaphoretic.  HENT:     Head: Normocephalic and atraumatic.     Ears:     Comments: Bilateral hearing aids    Nose: Nose normal.     Mouth/Throat:     Mouth: Mucous membranes are moist.  Eyes:     Conjunctiva/sclera: Conjunctivae normal.     Pupils: Pupils are equal, round, and reactive to light.  Cardiovascular:     Rate and Rhythm: Normal rate and regular rhythm.     Heart sounds: Normal heart sounds.  Pulmonary:     Effort: Pulmonary effort is normal.     Breath sounds: Normal breath sounds.  Skin:    General: Skin is warm and dry.     Capillary Refill: Capillary refill takes less than 2 seconds.     Findings: Lesion and rash present.     Comments: Images below Multiple stuck on appearing lesions to back, irregular borders and color differentiations. Vesicular rash to frontal hairline with slight erythema.  Dry, flaking rash to bilateral arms. Lesions to forehead,    Neurological:     General: No focal deficit present.     Mental Status: He is alert and oriented to person, place, and time.  Psychiatric:        Mood and  Affect: Mood normal.        Behavior: Behavior normal.        Thought Content: Thought content normal.        Judgment: Judgment normal.             Results for orders placed or performed in visit on 02/10/23  CBC with Differential/Platelet   Collection Time: 02/10/23  3:18 PM  Result Value Ref Range   WBC 9.3 3.4 - 10.8 x10E3/uL   RBC 4.49 4.14 - 5.80 x10E6/uL   Hemoglobin 13.4 13.0 - 17.7 g/dL   Hematocrit 16.1 09.6 - 51.0 %   MCV 89 79 - 97 fL   MCH 29.8 26.6 - 33.0 pg   MCHC 33.4 31.5 - 35.7 g/dL   RDW 04.5 40.9 - 81.1 %   Platelets 316 150 - 450 x10E3/uL   Neutrophils 66 Not Estab. %   Lymphs 23 Not Estab. %   Monocytes 8 Not Estab. %   Eos 3 Not Estab. %   Basos 0 Not Estab. %   Neutrophils Absolute 6.1 1.4 - 7.0 x10E3/uL   Lymphocytes Absolute 2.1 0.7 - 3.1 x10E3/uL   Monocytes Absolute 0.8 0.1 - 0.9 x10E3/uL   EOS (ABSOLUTE) 0.3 0.0 - 0.4 x10E3/uL   Basophils Absolute 0.0 0.0 - 0.2 x10E3/uL   Immature Granulocytes 0 Not Estab. %   Immature Grans (Abs) 0.0 0.0 - 0.1 x10E3/uL  CMP14+EGFR   Collection Time: 02/10/23  3:18 PM  Result Value Ref Range   Glucose 70 70 - 99 mg/dL   BUN 21 8 - 27 mg/dL   Creatinine, Ser 9.14 0.76 - 1.27 mg/dL   eGFR 56 (L) >78 GN/FAO/1.30   BUN/Creatinine Ratio 17 10 - 24   Sodium 142 134 - 144 mmol/L   Potassium 4.3 3.5 - 5.2 mmol/L   Chloride  103 96 - 106 mmol/L   CO2 24 20 - 29 mmol/L   Calcium 9.3 8.6 - 10.2 mg/dL   Total Protein 6.2 6.0 - 8.5 g/dL   Albumin 4.1 3.7 - 4.7 g/dL   Globulin, Total 2.1 1.5 - 4.5 g/dL   Bilirubin Total 0.3 0.0 - 1.2 mg/dL   Alkaline Phosphatase 84 44 - 121 IU/L   AST 22 0 - 40 IU/L   ALT 19 0 - 44 IU/L       Pertinent labs & imaging results that were available during my care of the patient were reviewed by me and considered in my medical decision making.  Assessment & Plan:  Leanord was seen today for rash.  Diagnoses and all orders for this visit:  Pruritic rash -     predniSONE   (DELTASONE ) 20 MG tablet; Take 2 tablets (40 mg total) by mouth daily with breakfast for 5 days. -     Ambulatory referral to Dermatology  Skin lesions -     predniSONE  (DELTASONE ) 20 MG tablet; Take 2 tablets (40 mg total) by mouth daily with breakfast for 5 days. -     Ambulatory referral to Dermatology  Decreased appetite -     CMP14+EGFR -     CBC with Differential/Platelet -     Vitamin B12 -     VITAMIN D 25 Hydroxy (Vit-D Deficiency, Fractures) -     Thyroid Panel With TSH       Seborrheic dermatitis Chronic seborrheic dermatitis with scalp rash causing significant pruritus and discomfort. Previous ketoconazole shampoo was ineffective and caused irritation. - Prescribe oral steroids to manage systemic pruritus and rash. - Advise use of ketoconazole cream on the scalp at night. - Recommend Eucerin cream as an emollient to rehydrate and repair the skin barrier. - Start Zyrtec nightly to alleviate pruritus and allergic reactions. - Refer to dermatology for further evaluation.  Loss of appetite Recent anorexia with decreased interest in food. Plan to evaluate underlying causes through laboratory tests to assess liver function, hydration status, vitamin B12, vitamin D, and thyroid function. - Order lab work to assess liver function, hydration status, vitamin B12, vitamin D, and thyroid function.  Actinic keratosis Presence of actinic keratosis with irregular shapes on the back, potentially precancerous. - Refer to dermatology for evaluation and potential cryotherapy treatment.  Seborrheic keratosis Multiple seborrheic keratoses with a stuck-on appearance, requiring dermatological evaluation due to irregular shapes and potential for precancerous changes. - Refer to dermatology for evaluation.           Continue all other maintenance medications.  Follow up plan: Return if symptoms worsen or fail to improve.    The above assessment and management plan was discussed  with the patient. The patient verbalized understanding of and has agreed to the management plan. Patient is aware to call the clinic if they develop any new symptoms or if symptoms persist or worsen. Patient is aware when to return to the clinic for a follow-up visit. Patient educated on when it is appropriate to go to the emergency department.   Kattie Parrot, FNP-C [facility] [phone]

## 2023-11-20 LAB — THYROID PANEL WITH TSH
Free Thyroxine Index: 2.6 (ref 1.2–4.9)
T3 Uptake Ratio: 30 % (ref 24–39)
T4, Total: 8.8 ug/dL (ref 4.5–12.0)
TSH: 1.84 u[IU]/mL (ref 0.450–4.500)

## 2023-11-20 LAB — CBC WITH DIFFERENTIAL/PLATELET
Basophils Absolute: 0 10*3/uL (ref 0.0–0.2)
Basos: 0 %
EOS (ABSOLUTE): 0.2 10*3/uL (ref 0.0–0.4)
Eos: 2 %
Hematocrit: 42.8 % (ref 37.5–51.0)
Hemoglobin: 14.2 g/dL (ref 13.0–17.7)
Immature Grans (Abs): 0 10*3/uL (ref 0.0–0.1)
Immature Granulocytes: 0 %
Lymphocytes Absolute: 1.7 10*3/uL (ref 0.7–3.1)
Lymphs: 22 %
MCH: 30.1 pg (ref 26.6–33.0)
MCHC: 33.2 g/dL (ref 31.5–35.7)
MCV: 91 fL (ref 79–97)
Monocytes Absolute: 0.7 10*3/uL (ref 0.1–0.9)
Monocytes: 9 %
Neutrophils Absolute: 4.9 10*3/uL (ref 1.4–7.0)
Neutrophils: 67 %
Platelets: 254 10*3/uL (ref 150–450)
RBC: 4.71 x10E6/uL (ref 4.14–5.80)
RDW: 13.8 % (ref 11.6–15.4)
WBC: 7.5 10*3/uL (ref 3.4–10.8)

## 2023-11-20 LAB — CMP14+EGFR
ALT: 16 IU/L (ref 0–44)
AST: 23 IU/L (ref 0–40)
Albumin: 3.9 g/dL (ref 3.7–4.7)
Alkaline Phosphatase: 74 IU/L (ref 44–121)
BUN/Creatinine Ratio: 12 (ref 10–24)
BUN: 17 mg/dL (ref 8–27)
Bilirubin Total: 0.4 mg/dL (ref 0.0–1.2)
CO2: 22 mmol/L (ref 20–29)
Calcium: 9.2 mg/dL (ref 8.6–10.2)
Chloride: 105 mmol/L (ref 96–106)
Creatinine, Ser: 1.45 mg/dL — ABNORMAL HIGH (ref 0.76–1.27)
Globulin, Total: 2.1 g/dL (ref 1.5–4.5)
Glucose: 96 mg/dL (ref 70–99)
Potassium: 4.4 mmol/L (ref 3.5–5.2)
Sodium: 140 mmol/L (ref 134–144)
Total Protein: 6 g/dL (ref 6.0–8.5)
eGFR: 46 mL/min/{1.73_m2} — ABNORMAL LOW (ref >59)

## 2023-11-20 LAB — VITAMIN B12: Vitamin B-12: >2000 pg/mL — ABNORMAL HIGH (ref 232–1245)

## 2023-11-20 LAB — VITAMIN D 25 HYDROXY (VIT D DEFICIENCY, FRACTURES): Vit D, 25-Hydroxy: 80.9 ng/mL (ref 30.0–100.0)

## 2023-11-21 ENCOUNTER — Ambulatory Visit: Payer: Self-pay | Admitting: Family Medicine

## 2024-01-11 ENCOUNTER — Ambulatory Visit: Payer: Self-pay

## 2024-01-11 NOTE — Telephone Encounter (Signed)
 noted

## 2024-01-11 NOTE — Telephone Encounter (Signed)
 FYI Only or Action Required?: FYI only for provider.  Patient was last seen in primary care on 11/19/2023 by Severa Rock HERO, FNP.  Called Nurse Triage reporting Rash.  Symptoms began several months ago.  Interventions attempted: OTC medications: Benadryl, Eucerin cream, ketoconazole cream, hydrocortisone cream.  Symptoms are: sever itchy pink/red scaly dry and flaky rash to chest/back/arms/scalp gradually worsening.  Triage Disposition: See Physician Within 24 Hours  Patient/caregiver understands and will follow disposition?: Yes              Copied from CRM [id number]. Topic: Clinical - Red Word Triage >> Jan 11, 2024 10:31 AM Selinda Nguyen wrote: Red Word that prompted transfer to Nurse Triage: Wesley Nguyen the daughter of the patient called in stating her father has had a horrible rash for months now. She says it is all over including his face and neck. He can't stop scratching because it itches and bothers him so much. He does have an appointment with a dermatologist but it is not until October and he has tried every lotion under the sun with no relief. I will transfer her to E2C2 NT Reason for Disposition  SEVERE itching (i.e., interferes with sleep, normal activities or school)  Answer Assessment - Initial Assessment Questions Daughter, Wesley Nguyen, on phone for triage with patient. She states they have treated with hydrocortisone, Benadryl, OTC Eucerin, ketoconazole cream. Patient states he doesn't remember if the prednisone  helped. She states they have tried switching different detergents.  1. APPEARANCE of RASH: What does the rash look like? (e.g., blisters, dry flaky skin, red spots, redness, sores)     Red, dry scaly flaky.  2. SIZE: How big are the spots? (e.g., tip of pen, eraser, coin; inches, centimeters)     She states big areas bigger than the size of a quarter.  3. LOCATION: Where is the rash located?     Back, chest, arms, scalp.  4. COLOR: What color is the  rash? (Note: It is difficult to assess rash color in people with darker-colored skin. When this situation occurs, simply ask the caller to describe what they see.)     Pink/light red.  5. ONSET: When did the rash begin?     Several months.  6. FEVER: Do you have a fever? If Yes, ask: What is your temperature, how was it measured, and when did it start?     No.  7. ITCHING: Does the rash itch? If Yes, ask: How bad is the itch? (Scale 1-10; or mild, moderate, severe)     Yes, severe.  8. CAUSE: What do you think is causing the rash?     Unsure, patient was seen 11/19/23 with PCP and is awaiting dermatology appt.  9. MEDICINE FACTORS: Have you started any new medicines within the last 2 weeks? (e.g., antibiotics)      No.  10. OTHER SYMPTOMS: Do you have any other symptoms? (e.g., dizziness, headache, sore throat, joint pain)       None. Denies facial swelling, difficulty breathing, difficulty swallowing.  11. PREGNANCY: Is there any chance you are pregnant? When was your last menstrual period?       N/A.  Protocols used: Rash or Redness - [facility]

## 2024-01-12 ENCOUNTER — Encounter: Payer: Self-pay | Admitting: Nurse Practitioner

## 2024-01-12 ENCOUNTER — Ambulatory Visit (INDEPENDENT_AMBULATORY_CARE_PROVIDER_SITE_OTHER): Admitting: Nurse Practitioner

## 2024-01-12 VITALS — BP 101/62 | HR 67 | Temp 97.7°F | Ht 67.0 in | Wt 146.8 lb

## 2024-01-12 DIAGNOSIS — J310 Chronic rhinitis: Secondary | ICD-10-CM

## 2024-01-12 DIAGNOSIS — L282 Other prurigo: Secondary | ICD-10-CM | POA: Diagnosis not present

## 2024-01-12 MED ORDER — LEVOCETIRIZINE DIHYDROCHLORIDE 5 MG PO TABS: 2.5000 mg | ORAL_TABLET | Freq: Every evening | ORAL | 0 refills | Status: AC

## 2024-01-12 MED ORDER — METHYLPREDNISOLONE ACETATE 40 MG/ML IJ SUSP
40.0000 mg | Freq: Once | INTRAMUSCULAR | Status: AC
  Administered 2024-01-12: 40 mg via INTRAMUSCULAR

## 2024-01-12 MED ORDER — CLOTRIMAZOLE 1 % EX CREA: 1.0000 | TOPICAL_CREAM | Freq: Two times a day (BID) | CUTANEOUS | 0 refills | Status: DC

## 2024-01-12 NOTE — Progress Notes (Signed)
 Acute Office Visit  Subjective:     Patient ID: Wesley Nguyen, male    DOB: 02/02/1932, 89 y.o.   MRN: [id number]  Chief Complaint  Patient presents with   Rash    Rash for couple weeks mainly on arms and shoulders. Itchy     HPI Wesley Nguyen is an 89 year old male who presents with his daughter on 01/12/2024 for evaluation of a persistent rash. He was previously seen by his primary care provider on 11/19/2023 for similar concerns and was referred to dermatology; however, he has not yet been evaluated due to a delayed appointment scheduled for October. The patient reports ongoing symptoms including pruritus, dry and erythematous skin, and scaly patches primarily affecting his arms and back. Symptoms have persisted despite prior treatment, which included a topical medication (details not provided). No signs of infection or systemic symptoms are reported at this time.  Active Ambulatory Problems    Diagnosis Date Noted   GERD (gastroesophageal reflux disease) 09/20/2019   Benign prostatic hyperplasia 06/10/2021   Resolved Ambulatory Problems    Diagnosis Date Noted   Hematemesis 08/31/2019   Nausea and vomiting 08/31/2019   Generalized weakness 08/31/2019   Nasal congestion with rhinorrhea 12/28/2022   Past Medical History:  Diagnosis Date   BPH (benign prostatic hyperplasia)     Review of Systems  Constitutional:  Negative for chills and fever.  HENT:  Negative for congestion and sore throat.   Respiratory:  Negative for cough, shortness of breath and wheezing.   Cardiovascular:  Negative for chest pain and leg swelling.  Skin:  Positive for itching and rash.  Neurological:  Negative for dizziness and headaches.   Negative unless indicated in HPI    Objective:    BP 101/62   Pulse 67   Temp 97.7 F (36.5 C) (Temporal)   Ht 5' 7 (1.702 m)   Wt 146 lb 12.8 oz (66.6 kg)   SpO2 100%   BMI 22.99 kg/m  BP Readings from Last 3 Encounters:  01/12/24 101/62  11/19/23  110/67  06/25/23 123/74   Wt Readings from Last 3 Encounters:  01/12/24 146 lb 12.8 oz (66.6 kg)  11/19/23 148 lb (67.1 kg)  06/25/23 148 lb 9.6 oz (67.4 kg)      Physical Exam Vitals and nursing note reviewed.  Constitutional:      General: He is not in acute distress. HENT:     Head: Normocephalic and atraumatic.     Nose: Nose normal.     Mouth/Throat:     Mouth: Mucous membranes are moist.  Eyes:     General: No scleral icterus.    Extraocular Movements: Extraocular movements intact.     Conjunctiva/sclera: Conjunctivae normal.     Pupils: Pupils are equal, round, and reactive to light.  Cardiovascular:     Heart sounds: Normal heart sounds.  Pulmonary:     Effort: Pulmonary effort is normal.     Breath sounds: Normal breath sounds.  Musculoskeletal:        General: Normal range of motion.     Right lower leg: No edema.     Left lower leg: No edema.  Skin:    General: Skin is warm and dry.     Findings: Rash present.  Neurological:     Mental Status: He is alert and oriented to person, place, and time.  Psychiatric:        Mood and Affect: Mood normal.  Behavior: Behavior normal.        Thought Content: Thought content normal.        Judgment: Judgment normal.          Pertinent labs & imaging results that were available during my care of the patient were reviewed by me and considered in my medical decision making.  No results found for any visits on 01/12/24.      Assessment & Plan:  Pruritic rash -     methylPREDNISolone  Acetate -     Clotrimazole ; Apply 1 Application topically 2 (two) times daily.  Dispense: 30 g; Refill: 0 -     Levocetirizine Dihydrochloride ; Take 0.5 tablets (2.5 mg total) by mouth every evening. For allergies, runny nose.  Dispense: 90 tablet; Refill: 0  Chronic rhinitis -     Levocetirizine Dihydrochloride ; Take 0.5 tablets (2.5 mg total) by mouth every evening. For allergies, runny nose.  Dispense: 90 tablet; Refill:  0   Wesley Nguyen is a 89 year old Caucasian male seen today for pruritic rash, no acute distress  IM injection of methylprednisolone  40 mg administered at the clinic Lotrimin  lotion twice daily; restart Xyzal  2.5 mg at bedtime Advised client to get over-the-counter Aveeno oatmeal bath, and to use hypoallergenic laundry detergent follow-up with dermatology as scheduled in October, 2025   The above assessment and management plan was discussed with the patient. The patient verbalized understanding of and has agreed to the management plan. Patient is aware to call the clinic if they develop any new symptoms or if symptoms persist or worsen. Patient is aware when to return to the clinic for a follow-up visit. Patient educated on when it is appropriate to go to the emergency department.  Return if symptoms worsen or fail to improve.  Wesley Nguyen [place] Thompson, DNP [facility] [address] [place], [place] [address] [phone]  Note: This document was prepared by [employer] voice dictation technology and any errors that results from this process are unintentional.

## 2024-01-25 ENCOUNTER — Other Ambulatory Visit: Payer: Self-pay | Admitting: Nurse Practitioner

## 2024-01-25 DIAGNOSIS — L282 Other prurigo: Secondary | ICD-10-CM

## 2024-04-06 ENCOUNTER — Ambulatory Visit: Admitting: Dermatology

## 2024-07-20 ENCOUNTER — Emergency Department (HOSPITAL_COMMUNITY)
Admission: EM | Admit: 2024-07-20 | Discharge: 2024-07-20 | Disposition: A | Discharge status: Home / Self Care | Attending: Emergency Medicine | Admitting: Emergency Medicine

## 2024-07-20 ENCOUNTER — Emergency Department (HOSPITAL_COMMUNITY)

## 2024-07-20 ENCOUNTER — Encounter (HOSPITAL_COMMUNITY): Payer: Self-pay

## 2024-07-20 ENCOUNTER — Ambulatory Visit: Payer: Self-pay

## 2024-07-20 ENCOUNTER — Other Ambulatory Visit: Payer: Self-pay

## 2024-07-20 DIAGNOSIS — J181 Lobar pneumonia, unspecified organism: Secondary | ICD-10-CM | POA: Insufficient documentation

## 2024-07-20 DIAGNOSIS — J189 Pneumonia, unspecified organism: Secondary | ICD-10-CM

## 2024-07-20 LAB — BASIC METABOLIC PANEL WITH GFR
Anion gap: 13 (ref 5–15)
BUN: 20 mg/dL (ref 8–23)
CO2: 21 mmol/L — ABNORMAL LOW (ref 22–32)
Calcium: 9 mg/dL (ref 8.9–10.3)
Chloride: 105 mmol/L (ref 98–111)
Creatinine, Ser: 1.34 mg/dL — ABNORMAL HIGH (ref 0.61–1.24)
GFR, Estimated: 50 mL/min — ABNORMAL LOW
Glucose, Bld: 104 mg/dL — ABNORMAL HIGH (ref 70–99)
Potassium: 4.2 mmol/L (ref 3.5–5.1)
Sodium: 140 mmol/L (ref 135–145)

## 2024-07-20 LAB — CBC
HCT: 44 % (ref 39.0–52.0)
Hemoglobin: 14.2 g/dL (ref 13.0–17.0)
MCH: 29.6 pg (ref 26.0–34.0)
MCHC: 32.3 g/dL (ref 30.0–36.0)
MCV: 91.7 fL (ref 80.0–100.0)
Platelets: 225 10*3/uL (ref 150–400)
RBC: 4.8 MIL/uL (ref 4.22–5.81)
RDW: 13.8 % (ref 11.5–15.5)
WBC: 10.8 10*3/uL — ABNORMAL HIGH (ref 4.0–10.5)
nRBC: 0 % (ref 0.0–0.2)

## 2024-07-20 LAB — TROPONIN T, HIGH SENSITIVITY
Troponin T High Sensitivity: 76 ng/L — ABNORMAL HIGH (ref 0–19)
Troponin T High Sensitivity: 73 ng/L — ABNORMAL HIGH (ref 0–19)

## 2024-07-20 MED ORDER — AMOXICILLIN-POT CLAVULANATE 200-28.5 MG/5ML PO SUSR: 250.0000 mg | Freq: Once | ORAL | Status: DC

## 2024-07-20 MED ORDER — AMOXICILLIN-POT CLAVULANATE 875-125 MG PO TABS: 1.0000 | ORAL_TABLET | Freq: Two times a day (BID) | ORAL | 0 refills | Status: DC

## 2024-07-20 MED ORDER — AMOXICILLIN-POT CLAVULANATE 400-57 MG/5ML PO SUSR
800.0000 mg | Freq: Once | ORAL | Status: AC
  Administered 2024-07-20: 800 mg via ORAL
  Filled 2024-07-20: qty 10

## 2024-07-20 MED ORDER — AMOXICILLIN-POT CLAVULANATE 875-125 MG PO TABS
1.0000 | ORAL_TABLET | Freq: Once | ORAL | Status: DC
  Filled 2024-07-20: qty 1

## 2024-07-20 MED ORDER — AZITHROMYCIN 250 MG PO TABS: 250.0000 mg | ORAL_TABLET | Freq: Every day | ORAL | 0 refills | Status: DC

## 2024-07-20 MED ORDER — AZITHROMYCIN 200 MG/5ML PO SUSR
500.0000 mg | Freq: Once | ORAL | Status: AC
  Administered 2024-07-20: 500 mg via ORAL
  Filled 2024-07-20: qty 15

## 2024-07-20 MED ORDER — AZITHROMYCIN 200 MG/5ML PO SUSR: 250.0000 mg | Freq: Every day | ORAL | 0 refills | Status: DC

## 2024-07-20 MED ORDER — AZITHROMYCIN 250 MG PO TABS
500.0000 mg | ORAL_TABLET | Freq: Once | ORAL | Status: DC
  Filled 2024-07-20: qty 2

## 2024-07-20 NOTE — ED Notes (Signed)
 MD notified that pt was unable to swallow oral medication due to previous injury to throat. Pharmacy notified. New orders placed for appropriate medication type.

## 2024-07-20 NOTE — Telephone Encounter (Signed)
 Ed advised

## 2024-07-20 NOTE — ED Provider Notes (Signed)
 " [place] [facility] DE[facility]MENT AT [facility] Provider Note   CSN: [id number] Arrival date & time: 07/20/24  1209     Patient presents with: Chest Pain   Wesley Nguyen is a 89 y.o. male.   HPI Presents with his daughter who assists with the history.  He notes that over the past day he has had pain with breathing, particular with inspiration.  Pain began after he was shoveling, currently is negligible.  No fever.  Patient is generally well, was so prior to the onset of symptoms.     Prior to Admission medications  Medication Sig Start Date End Date Taking? Authorizing Provider  amoxicillin -clavulanate (AUGMENTIN ) 875-125 MG tablet Take 1 tablet by mouth every 12 (twelve) hours. 07/20/24  Yes Garrick Charleston, MD  azithromycin  (ZITHROMAX ) 250 MG tablet Take 1 tablet (250 mg total) by mouth daily for 4 days. Take 1 every day until finished. 07/20/24 07/24/24 Yes Garrick Charleston, MD  clotrimazole  (LOTRIMIN ) 1 % cream APPLY TO THE AFFECTED AREA(S) TWICE DAILY 01/25/24   Severa Rock HERO, FNP  fluticasone  (FLONASE ) 50 MCG/ACT nasal spray Place 2 sprays into both nostrils daily. 12/28/22   [facility] Sebastian Pool, NP  glucosamine-chondroitin 500-400 MG tablet Take 1 tablet by mouth 3 (three) times daily.    [provider]  Homeopathic Products (ALLERGY MEDICINE PO) Take by mouth. Patient not taking: Reported on 01/12/2024    [provider]  levocetirizine (XYZAL ) 5 MG tablet Take 0.5 tablets (2.5 mg total) by mouth every evening. For allergies, runny nose. 01/12/24   [facility] Sebastian Pool, NP  meclizine  (ANTIVERT ) 25 MG tablet Take 1 tablet (25 mg total) by mouth 3 (three) times daily as needed for dizziness. 02/10/23   Zollie Lowers, MD  Multiple Vitamin (MULTIVITAMIN) tablet Take 1 tablet by mouth daily.    [provider]  pantoprazole  (PROTONIX ) 40 MG tablet Take 1 tablet (40 mg total) by mouth daily. 09/01/19 01/12/24  Maree, Pratik D, DO  tamsulosin   (FLOMAX ) 0.4 MG CAPS capsule 1 capsule Orally Once a day    [provider]    Allergies: Naproxen sodium and Bactrim [sulfamethoxazole-trimethoprim]    Review of Systems  Updated Vital Signs BP (!) 138/91 (BP Location: Right Arm)   Pulse 78   Temp 98 F (36.7 C)   Resp 18   Ht 1.702 m (5' 7)   Wt 66.6 kg   SpO2 99%   BMI 23.00 kg/m   Physical Exam Vitals and nursing note reviewed.  Constitutional:      General: He is not in acute distress.    Appearance: He is well-developed.  HENT:     Head: Normocephalic and atraumatic.  Eyes:     Conjunctiva/sclera: Conjunctivae normal.  Cardiovascular:     Rate and Rhythm: Normal rate and regular rhythm.  Pulmonary:     Effort: Pulmonary effort is normal. No respiratory distress.     Breath sounds: No stridor.  Abdominal:     General: There is no distension.  Skin:    General: Skin is warm and dry.  Neurological:     Mental Status: He is alert and oriented to person, place, and time.     (all labs ordered are listed, but only abnormal results are displayed) Labs Reviewed  BASIC METABOLIC PANEL WITH GFR - Abnormal; Notable for the following components:      Result Value   CO2 21 (*)    Glucose, Bld 104 (*)  Creatinine, Ser 1.34 (*)    GFR, Estimated 50 (*)    All other components within normal limits  CBC - Abnormal; Notable for the following components:   WBC 10.8 (*)    All other components within normal limits  TROPONIN T, HIGH SENSITIVITY - Abnormal; Notable for the following components:   Troponin T High Sensitivity 76 (*)    All other components within normal limits  TROPONIN T, HIGH SENSITIVITY    EKG: EKG Interpretation Date/Time:  Thursday July 20 2024 12:19:32 EST Ventricular Rate:  82 PR Interval:  140 QRS Duration:  84 QT Interval:  368 QTC Calculation: 429 R Axis:   64  Text Interpretation: Normal sinus rhythm Normal ECG Confirmed by Garrick Charleston [phone]) on 07/20/2024 12:24:21  PM  Radiology: [facility] Chest Port 1 View Result Date: 07/20/2024 EXAM: 1 VIEW XRAY OF THE CHEST 07/20/2024 12:36:00 PM COMPARISON: 02/10/2023 CLINICAL HISTORY: Chest pain. FINDINGS: LUNGS AND PLEURA: New asymmetric opacity in the left lower lung which may reflect atelectasis or pneumonia. No pleural effusion. No pneumothorax. HEART AND MEDIASTINUM: Calcified aorta. BONES AND SOFT TISSUES: No acute osseous abnormality. IMPRESSION: 1. New asymmetric left lower lung opacity, most consistent with atelectasis or pneumonia; differential includes aspiration. 2. Follow-up chest radiograph in 6-8 weeks to document resolution; consider chest CT if the opacity persists or progresses. Electronically signed by: Waddell Calk MD 07/20/2024 01:40 PM EST RP Workstation: [id number]     Procedures   Medications Ordered in the ED  amoxicillin -clavulanate (AUGMENTIN ) 875-125 MG per tablet 1 tablet (has no administration in time range)  azithromycin  (ZITHROMAX ) tablet 500 mg (has no administration in time range)                                    Medical Decision Making Adult male, generally well, without history of cardiac disease presents with breathing difficulty, chest pain, though essentially tied to respiratory activity. Broad differential including ACS, pneumonia, less likely PE without risk profile. Initial vitals reassuring cardiac 80 sinus normal pulse ox 99% room air normal  Amount and/or Complexity of Data Reviewed Independent Historian:     Details: Daughter/caregiver External Data Reviewed: notes. Labs: ordered. Decision-making details documented in ED Course. Radiology: ordered and independent interpretation performed. Decision-making details documented in ED Course. ECG/medicine tests: ordered and independent interpretation performed. Decision-making details documented in ED Course.  Risk Prescription drug management. Decision regarding hospitalization. Diagnosis or treatment significantly  limited by social determinants of health.   2:07 PM Patient in no distress hemodynamically unremarkable.  Labs reviewed, discussed, labs unremarkable, x-ray with evidence for left lower lobe pneumonia.  He does have trivial elevation in troponin, but his chest pain-free, has nonischemic EKG.  We discussed admission given his age, pneumonia, patient has a preference to go home, and after discussion on return precautions we discharged in stable condition.  Patient will follow-up with primary care.     Final diagnoses:  Community acquired pneumonia of left lower lobe of lung    ED Discharge Orders          Ordered    amoxicillin -clavulanate (AUGMENTIN ) 875-125 MG tablet  Every 12 hours        07/20/24 1407    azithromycin  (ZITHROMAX ) 250 MG tablet  Daily        07/20/24 1407               Garrick Charleston, MD 07/20/24 1407  "

## 2024-07-20 NOTE — Discharge Instructions (Signed)
 Do not hesitate to return here for concerning changes in your condition.  Otherwise follow-up with your physician.  Given today's finding of pneumonia, with its associated chest discomfort it is important to do so.

## 2024-07-20 NOTE — ED Triage Notes (Signed)
 Pt reports a burning pain sensation center chest does not radiate anywhere. Pt states chest pain started after attempting to clear off side walk. Pt reports not other symptoms with it.

## 2024-07-20 NOTE — Telephone Encounter (Signed)
 FYI Only or Action Required?: FYI only for provider: ED advised.  Patient was last seen in primary care on 01/12/2024 by Deitra Morton Sebastian Nena, NP.  Called Nurse Triage reporting Chest Pain.  Symptoms began yesterday.  Interventions attempted: Nothing.  Symptoms are: unchanged.  Triage Disposition: Go to ED Now (or PCP Triage)  Patient/caregiver understands and will follow disposition?: Yes      Message from [facility] D sent at 07/20/2024 10:58 AM EST  Pt is having chest pain when he breathes he was outside working a yesterday trying to get rid of ice.   Reason for Disposition  Taking a deep breath makes pain worse  Answer Assessment - Initial Assessment Questions 1. LOCATION: Where does it hurt?       Mid chest pain  2. RADIATION: Does the pain go anywhere else? (e.g., into neck, jaw, arms, back)     no 3. ONSET: When did the chest pain begin? (Minutes, hours or days)      Hurts with deep breath 4. PATTERN: Does the pain come and go, or has it been constant since it started?  Does it get worse with exertion?      With deep breath in  5. DURATION: How long does it last (e.g., seconds, minutes, hours)     *No Answer* 6. SEVERITY: How bad is the pain?  (e.g., Scale 1-10; mild, moderate, or severe)     3/10 7. CARDIAC RISK FACTORS: Do you have any history of heart problems or risk factors for heart disease? (e.g., angina, prior heart attack; diabetes, high blood pressure, high cholesterol, smoker, or strong family history of heart disease)     *No Answer* 8. PULMONARY RISK FACTORS: Do you have any history of lung disease?  (e.g., blood clots in lung, asthma, emphysema, birth control pills)     *No Answer* 9. CAUSE: What do you think is causing the chest pain?     Pna/or overexertion  10. OTHER SYMPTOMS: Do you have any other symptoms? (e.g., dizziness, nausea, vomiting, sweating, fever, difficulty breathing, cough)       No  11. PREGNANCY: Is there  any chance you are pregnant? When was your last menstrual period?       N/a  Protocols used: Chest Pain-A-AH

## 2024-07-20 NOTE — ED Notes (Signed)
 Verified pt has no ambulation concerns before discharge

## 2024-07-21 ENCOUNTER — Ambulatory Visit: Payer: Self-pay

## 2024-07-21 NOTE — Telephone Encounter (Signed)
 Seen in ED yesterday, diagnosed with pneumonia, sent home with Augmentin  and liquid Azithromycin . F/u appt scheduled for 2/17; please call patient and advise if sooner appt is available.      FYI Only or Action Required?: Action required by provider: request for appointment.  Patient was last seen in primary care on 01/12/2024 by Deitra Morton Sebastian Nena, NP.  Called Nurse Triage reporting Medication Problem.  Symptoms began several days ago.  Interventions attempted: Prescription medications: azithromycin , augmentin .  Symptoms are: unchanged.  Triage Disposition: See PCP Within 2 Weeks  Patient/caregiver understands and will follow disposition?: Yes  Reason for Triage: Worsening chest pains now in other areas up chest - think it may be bronchial tubes. seen at ED - diagnosed pneumonia -prescribed liquid antibiotic - advised to take one dropper does not feel like that is enough - not sure if they will be snowed in.    Reason for Disposition  Requesting regular office appointment  Answer Assessment - Initial Assessment Questions 1. REASON FOR CALL: What is the main reason for your call? or How can I best help you?     Scheduling hospital f/u appt  Protocols used: Information Only Call - No Triage-A-AH

## 2024-07-21 NOTE — Telephone Encounter (Signed)
 Appt scheduled

## 2024-07-25 ENCOUNTER — Encounter: Payer: Self-pay | Admitting: Family Medicine

## 2024-07-25 ENCOUNTER — Ambulatory Visit: Admitting: Family Medicine

## 2024-07-25 VITALS — BP 136/87 | HR 72 | Temp 97.9°F | Ht 67.0 in | Wt 152.6 lb

## 2024-07-25 DIAGNOSIS — J189 Pneumonia, unspecified organism: Secondary | ICD-10-CM | POA: Diagnosis not present

## 2024-07-25 MED ORDER — ALBUTEROL SULFATE HFA 108 (90 BASE) MCG/ACT IN AERS: 2.0000 | INHALATION_SPRAY | Freq: Four times a day (QID) | RESPIRATORY_TRACT | 0 refills | Status: AC | PRN

## 2024-07-25 MED ORDER — AMOXICILLIN-POT CLAVULANATE 400-57 MG/5ML PO SUSR: 875.0000 mg | Freq: Two times a day (BID) | ORAL | 0 refills | Status: AC

## 2024-07-27 ENCOUNTER — Telehealth: Payer: Self-pay | Admitting: Family Medicine

## 2024-07-27 NOTE — Telephone Encounter (Signed)
 Called and spoke with patients daughter who is [place]. She states that her father really does not understand who to use the inhaler good. She is trying to help, but she is not sure if he is getting it in right amount. She is wondering id a nebulizer machine with meds could help him better. Please advise.

## 2024-07-27 NOTE — Telephone Encounter (Signed)
 Called and left message.

## 2024-07-27 NOTE — Telephone Encounter (Signed)
 Copied from CRM [id number]. Topic: Clinical - Medication Question >> Jul 27, 2024 10:37 AM Travis F wrote: Reason for CRM: Dorothe Sharps, patient's daughter is calling in because he was prescribed an inhaler for pneumonia and the inhaler isn't working for patient. Dorothe says she has a nebulizer with the solution and she wants to know if he could use that instead.

## 2024-08-03 ENCOUNTER — Ambulatory Visit: Admitting: Nurse Practitioner

## 2024-08-08 ENCOUNTER — Inpatient Hospital Stay
